# Patient Record
Sex: Male | Born: 1985 | Race: Black or African American | Hispanic: No | Marital: Single | Smoking: Current every day smoker
Health system: Southern US, Community
[De-identification: ages and names within clinical notes are randomized; demographics above are authoritative.]

## PROBLEM LIST (undated history)

## (undated) ENCOUNTER — Emergency Department: Discharge status: Absent without leave | Payer: Self-pay

## (undated) DIAGNOSIS — Z86711 Personal history of pulmonary embolism: Secondary | ICD-10-CM

## (undated) DIAGNOSIS — F209 Schizophrenia, unspecified: Secondary | ICD-10-CM

## (undated) DIAGNOSIS — F319 Bipolar disorder, unspecified: Secondary | ICD-10-CM

---
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
## 2006-04-23 ENCOUNTER — Emergency Department: Payer: Self-pay | Admitting: Emergency Medicine

## 2007-04-04 ENCOUNTER — Emergency Department: Payer: Self-pay | Admitting: Emergency Medicine

## 2007-07-14 ENCOUNTER — Emergency Department: Payer: Self-pay | Admitting: Emergency Medicine

## 2008-06-15 ENCOUNTER — Emergency Department: Payer: Self-pay | Admitting: Emergency Medicine

## 2010-12-22 ENCOUNTER — Emergency Department: Payer: Self-pay | Admitting: Emergency Medicine

## 2010-12-31 ENCOUNTER — Emergency Department: Payer: Self-pay | Admitting: Emergency Medicine

## 2011-02-22 ENCOUNTER — Emergency Department: Payer: Self-pay | Admitting: Emergency Medicine

## 2011-02-26 ENCOUNTER — Emergency Department: Payer: Self-pay | Admitting: Emergency Medicine

## 2011-08-22 ENCOUNTER — Emergency Department: Payer: Self-pay | Admitting: Emergency Medicine

## 2011-12-17 ENCOUNTER — Emergency Department: Payer: Self-pay | Admitting: Emergency Medicine

## 2013-06-04 ENCOUNTER — Emergency Department: Payer: Self-pay | Admitting: Emergency Medicine

## 2014-05-06 ENCOUNTER — Emergency Department: Payer: Self-pay | Admitting: Emergency Medicine

## 2014-05-08 LAB — BETA STREP CULTURE(ARMC)

## 2017-02-09 ENCOUNTER — Encounter: Payer: Self-pay | Admitting: Emergency Medicine

## 2017-02-09 ENCOUNTER — Emergency Department: Payer: Self-pay

## 2017-02-09 ENCOUNTER — Emergency Department
Admission: EM | Admit: 2017-02-09 | Discharge: 2017-02-09 | Disposition: A | Discharge status: Home / Self Care | Payer: Self-pay | Attending: Emergency Medicine | Admitting: Emergency Medicine

## 2017-02-09 DIAGNOSIS — W501XXA Accidental kick by another person, initial encounter: Secondary | ICD-10-CM | POA: Insufficient documentation

## 2017-02-09 DIAGNOSIS — Y999 Unspecified external cause status: Secondary | ICD-10-CM | POA: Insufficient documentation

## 2017-02-09 DIAGNOSIS — Y929 Unspecified place or not applicable: Secondary | ICD-10-CM | POA: Insufficient documentation

## 2017-02-09 DIAGNOSIS — Y939 Activity, unspecified: Secondary | ICD-10-CM | POA: Insufficient documentation

## 2017-02-09 DIAGNOSIS — F17200 Nicotine dependence, unspecified, uncomplicated: Secondary | ICD-10-CM | POA: Insufficient documentation

## 2017-02-09 DIAGNOSIS — S93601A Unspecified sprain of right foot, initial encounter: Secondary | ICD-10-CM | POA: Insufficient documentation

## 2017-02-09 MED ORDER — IBUPROFEN 600 MG PO TABS: 600.0000 mg | ORAL_TABLET | Freq: Four times a day (QID) | ORAL | 0 refills | Status: DC | PRN

## 2017-02-09 MED ORDER — OXYCODONE-ACETAMINOPHEN 5-325 MG PO TABS: 1.0000 | ORAL_TABLET | Freq: Four times a day (QID) | ORAL | 0 refills | Status: DC | PRN

## 2017-02-09 MED ORDER — IBUPROFEN 600 MG PO TABS
600.0000 mg | ORAL_TABLET | Freq: Once | ORAL | Status: AC
  Administered 2017-02-09: 600 mg via ORAL
  Filled 2017-02-09: qty 1

## 2017-02-09 MED ORDER — OXYCODONE-ACETAMINOPHEN 5-325 MG PO TABS
1.0000 | ORAL_TABLET | Freq: Once | ORAL | Status: AC
  Administered 2017-02-09: 1 via ORAL
  Filled 2017-02-09: qty 1

## 2017-02-09 NOTE — ED Triage Notes (Signed)
States he slipped down step last pm  Twisted right foot  Positive swelling noted unable to bear wt

## 2017-02-09 NOTE — Discharge Instructions (Signed)
Ambulate with support and follow discharge care instructions.

## 2017-02-09 NOTE — ED Provider Notes (Signed)
[facility] Emergency Department Provider Note   ____________________________________________   None    (approximate)  I have reviewed the triage vital signs and the nursing notes.   HISTORY  Chief Complaint Foot Injury    HPI Patrick Sparks is a 31 y.o. male patient complain of right dorsal foot pain secondary to a slip and twisting incident last night. Patient states since the fall he is unable to bear weight without pain. No palliative measures taken for this complaint. Patient rates the pain at 7/10.Patient described the pain as "sharp".   History reviewed. No pertinent past medical history.  There are no active problems to display for this patient.   History reviewed. No pertinent surgical history.  Prior to Admission medications   Medication Sig Start Date End Date Taking? Authorizing Provider  ibuprofen (ADVIL,MOTRIN) 600 MG tablet Take 1 tablet (600 mg total) by mouth every 6 (six) hours as needed. 02/09/17   Joni Reiningonald K Smith, PA-C  oxyCODONE-acetaminophen (ROXICET) 5-325 MG tablet Take 1 tablet by mouth every 6 (six) hours as needed for severe pain. 02/09/17   Joni Reiningonald K Smith, PA-C    Allergies Patient has no known allergies.  No family history on file.  Social History Social History  Substance Use Topics  . Smoking status: Current Every Day Smoker  . Smokeless tobacco: Never Used  . Alcohol use No    Review of Systems Constitutional: No fever/chills Eyes: No visual changes. ENT: No sore throat. Cardiovascular: Denies chest pain. Respiratory: Denies shortness of breath. Gastrointestinal: No abdominal pain.  No nausea, no vomiting.  No diarrhea.  No constipation. Genitourinary: Negative for dysuria. Musculoskeletal: Right dorsal foot pain Skin: Negative for rash. Neurological: Negative for headaches, focal weakness or numbness.    ____________________________________________   PHYSICAL EXAM:  VITAL SIGNS: ED  Triage Vitals  Enc Vitals Group     BP 02/09/17 1705 (!) 141/75     Pulse Rate 02/09/17 1705 79     Resp 02/09/17 1705 20     Temp 02/09/17 1705 98.4 F (36.9 C)     Temp Source 02/09/17 1705 Oral     SpO2 02/09/17 1705 98 %     Weight 02/09/17 1705 230 lb (104.3 kg)     Height 02/09/17 1705 5\' 8"  (1.727 m)     Head Circumference --      Peak Flow --      Pain Score 02/09/17 1706 7     Pain Loc --      Pain Edu? --      Excl. in GC? --     Constitutional: Alert and oriented. Well appearing and in no acute distress. Eyes: Conjunctivae are normal. PERRL. EOMI. Head: Atraumatic. Nose: No congestion/rhinnorhea. Mouth/Throat: Mucous membranes are moist.  Oropharynx non-erythematous. Neck: No stridor.  No cervical spine tenderness to palpation. Hematological/Lymphatic/Immunilogical: No cervical lymphadenopathy. Cardiovascular: Normal rate, regular rhythm. Grossly normal heart sounds.  Good peripheral circulation. Respiratory: Normal respiratory effort.  No retractions. Lungs CTAB. Gastrointestinal: Soft and nontender. No distention. No abdominal bruits. No CVA tenderness. Musculoskeletal: Obvious edema but no obvious deformity to the dorsal aspect of the right foot. Neurologic:  Normal speech and language. No gross focal neurologic deficits are appreciated. No gait instability. Skin:  Skin is warm, dry and intact. No rash noted. Psychiatric: Mood and affect are normal. Speech and behavior are normal.  ____________________________________________   LABS (all labs ordered are listed, but only abnormal results are displayed)  Labs Reviewed -  No data to display ____________________________________________  EKG   ____________________________________________  RADIOLOGY  No acute findings on x-ray of the right foot. ____________________________________________   PROCEDURES  Procedure(s) performed: None  Procedures  Critical Care performed:  No  ____________________________________________   INITIAL IMPRESSION / ASSESSMENT AND PLAN / ED COURSE  Pertinent labs & imaging results that were available during my care of the patient were reviewed by me and considered in my medical decision making (see chart for details).  Sprain right foot. Discuss x-ray finding with patient. Patient given discharge care instructions. Patient given ibuprofen and Percocets. Patient given crutches for ambulation. Patient [facility] no improvement within 3-5 days.      ____________________________________________   FINAL CLINICAL IMPRESSION(S) / ED DIAGNOSES  Final diagnoses:  Sprain of right foot, initial encounter      NEW MEDICATIONS STARTED DURING THIS VISIT:  New Prescriptions   IBUPROFEN (ADVIL,MOTRIN) 600 MG TABLET    Take 1 tablet (600 mg total) by mouth every 6 (six) hours as needed.   OXYCODONE-ACETAMINOPHEN (ROXICET) 5-325 MG TABLET    Take 1 tablet by mouth every 6 (six) hours as needed for severe pain.     Note:  This document was prepared using Dragon voice recognition software and may include unintentional dictation errors.    Joni Reining, PA-C 02/09/17 1805    Loleta Rose, MD 02/09/17 [phone]

## 2017-03-08 ENCOUNTER — Emergency Department
Admission: EM | Admit: 2017-03-08 | Discharge: 2017-03-08 | Disposition: A | Discharge status: Home / Self Care | Payer: Self-pay | Attending: Emergency Medicine | Admitting: Emergency Medicine

## 2017-03-08 DIAGNOSIS — Z79899 Other long term (current) drug therapy: Secondary | ICD-10-CM | POA: Insufficient documentation

## 2017-03-08 DIAGNOSIS — F17200 Nicotine dependence, unspecified, uncomplicated: Secondary | ICD-10-CM | POA: Insufficient documentation

## 2017-03-08 DIAGNOSIS — F1910 Other psychoactive substance abuse, uncomplicated: Secondary | ICD-10-CM | POA: Insufficient documentation

## 2017-03-08 LAB — COMPREHENSIVE METABOLIC PANEL
ALT: 28 U/L (ref 17–63)
ANION GAP: 9 (ref 5–15)
AST: 30 U/L (ref 15–41)
Albumin: 4.3 g/dL (ref 3.5–5.0)
Alkaline Phosphatase: 54 U/L (ref 38–126)
BILIRUBIN TOTAL: 0.8 mg/dL (ref 0.3–1.2)
BUN: 6 mg/dL (ref 6–20)
CALCIUM: 9.2 mg/dL (ref 8.9–10.3)
CO2: 25 mmol/L (ref 22–32)
CREATININE: 0.73 mg/dL (ref 0.61–1.24)
Chloride: 105 mmol/L (ref 101–111)
Glucose, Bld: 99 mg/dL (ref 65–99)
Potassium: 3.6 mmol/L (ref 3.5–5.1)
SODIUM: 139 mmol/L (ref 135–145)
TOTAL PROTEIN: 8.4 g/dL — AB (ref 6.5–8.1)

## 2017-03-08 LAB — CBC
HCT: 46.9 % (ref 40.0–52.0)
Hemoglobin: 15.7 g/dL (ref 13.0–18.0)
MCH: 30.1 pg (ref 26.0–34.0)
MCHC: 33.6 g/dL (ref 32.0–36.0)
MCV: 89.7 fL (ref 80.0–100.0)
PLATELETS: 216 10*3/uL (ref 150–440)
RBC: 5.23 MIL/uL (ref 4.40–5.90)
RDW: 14.8 % — AB (ref 11.5–14.5)
WBC: 6 10*3/uL (ref 3.8–10.6)

## 2017-03-08 LAB — URINE DRUG SCREEN, QUALITATIVE (ARMC ONLY)
AMPHETAMINES, UR SCREEN: NOT DETECTED
BENZODIAZEPINE, UR SCRN: NOT DETECTED
Barbiturates, Ur Screen: NOT DETECTED
Cannabinoid 50 Ng, Ur ~~LOC~~: NOT DETECTED
Cocaine Metabolite,Ur ~~LOC~~: POSITIVE — AB
MDMA (ECSTASY) UR SCREEN: NOT DETECTED
Methadone Scn, Ur: NOT DETECTED
Opiate, Ur Screen: NOT DETECTED
PHENCYCLIDINE (PCP) UR S: NOT DETECTED
TRICYCLIC, UR SCREEN: NOT DETECTED

## 2017-03-08 LAB — ETHANOL: ALCOHOL ETHYL (B): 177 mg/dL — AB (ref <5)

## 2017-03-08 LAB — TSH: TSH: 0.632 u[IU]/mL (ref 0.350–4.500)

## 2017-03-08 MED ORDER — HALOPERIDOL LACTATE 5 MG/ML IJ SOLN: 5.0000 mg | Freq: Four times a day (QID) | INTRAMUSCULAR | Status: DC | PRN

## 2017-03-08 MED ORDER — LORAZEPAM 2 MG/ML IJ SOLN
INTRAMUSCULAR | Status: AC
  Filled 2017-03-08: qty 1

## 2017-03-08 MED ORDER — LORAZEPAM 2 MG/ML IJ SOLN: 2.0000 mg | Freq: Four times a day (QID) | INTRAMUSCULAR | Status: DC | PRN

## 2017-03-08 MED ORDER — HALOPERIDOL LACTATE 5 MG/ML IJ SOLN
INTRAMUSCULAR | Status: DC
Start: 2017-03-08 — End: 2017-03-08
  Filled 2017-03-08: qty 1

## 2017-03-08 NOTE — ED Notes (Signed)
Pt will be discharged - awaiting reversal
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
## 2017-03-08 NOTE — ED Notes (Signed)
t-shirt, jeans, gray tennis shoes, cell phone, yellow ring,13 cents, an condoms in a ziploc bag with his id
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
## 2017-03-08 NOTE — ED Notes (Signed)
Pt moved from hallway into room 22

## 2017-03-08 NOTE — Discharge Instructions (Signed)
Please stop using cocaine and especially drinking alcohol. As the combined ingredient will create a heart related toxin. Please follow-up with our [facility] services for further outpatient evaluation for posttraumatic stress disorder, etc. ° °Please return immediately if condition worsens. Please contact her primary physician or the physician you were given for referral. If you have any specialist physicians involved in her treatment and plan please also contact them. Thank you for using [facility]. ° °

## 2017-03-08 NOTE — BH Assessment (Signed)
SOC calling in. TTS to consult at later time.

## 2017-03-08 NOTE — ED Notes (Signed)
Pt calling sister asking "what the fuck! Now I'm admitted". Continued talking to sister and asking why she called on him.

## 2018-02-13 ENCOUNTER — Other Ambulatory Visit: Payer: Self-pay

## 2018-02-13 ENCOUNTER — Emergency Department: Payer: No Typology Code available for payment source

## 2018-02-13 ENCOUNTER — Emergency Department
Admission: EM | Admit: 2018-02-13 | Discharge: 2018-02-13 | Disposition: A | Discharge status: Home / Self Care | Payer: No Typology Code available for payment source | Attending: Emergency Medicine | Admitting: Emergency Medicine

## 2018-02-13 DIAGNOSIS — R0789 Other chest pain: Secondary | ICD-10-CM | POA: Diagnosis not present

## 2018-02-13 DIAGNOSIS — M7918 Myalgia, other site: Secondary | ICD-10-CM

## 2018-02-13 DIAGNOSIS — Y9301 Activity, walking, marching and hiking: Secondary | ICD-10-CM | POA: Diagnosis not present

## 2018-02-13 DIAGNOSIS — F17200 Nicotine dependence, unspecified, uncomplicated: Secondary | ICD-10-CM | POA: Insufficient documentation

## 2018-02-13 DIAGNOSIS — Y92410 Unspecified street and highway as the place of occurrence of the external cause: Secondary | ICD-10-CM | POA: Insufficient documentation

## 2018-02-13 DIAGNOSIS — Y999 Unspecified external cause status: Secondary | ICD-10-CM | POA: Insufficient documentation

## 2018-02-13 DIAGNOSIS — M25512 Pain in left shoulder: Secondary | ICD-10-CM | POA: Diagnosis not present

## 2018-02-13 LAB — BASIC METABOLIC PANEL
Anion gap: 13 (ref 5–15)
BUN: 9 mg/dL (ref 6–20)
CHLORIDE: 105 mmol/L (ref 101–111)
CO2: 22 mmol/L (ref 22–32)
CREATININE: 0.74 mg/dL (ref 0.61–1.24)
Calcium: 8.9 mg/dL (ref 8.9–10.3)
GFR calc Af Amer: >60 mL/min (ref >60)
GFR calc non Af Amer: >60 mL/min (ref >60)
Glucose, Bld: 120 mg/dL — ABNORMAL HIGH (ref 65–99)
POTASSIUM: 4.2 mmol/L (ref 3.5–5.1)
Sodium: 140 mmol/L (ref 135–145)

## 2018-02-13 LAB — CBC
HEMATOCRIT: 43.4 % (ref 40.0–52.0)
HEMOGLOBIN: 14.4 g/dL (ref 13.0–18.0)
MCH: 28.7 pg (ref 26.0–34.0)
MCHC: 33.1 g/dL (ref 32.0–36.0)
MCV: 86.8 fL (ref 80.0–100.0)
Platelets: 216 10*3/uL (ref 150–440)
RBC: 5 MIL/uL (ref 4.40–5.90)
RDW: 12 % (ref 11.5–14.5)
WBC: 8.9 10*3/uL (ref 3.8–10.6)

## 2018-02-13 MED ORDER — ONDANSETRON HCL 4 MG/2ML IJ SOLN
4.0000 mg | Freq: Once | INTRAMUSCULAR | Status: AC
  Administered 2018-02-13: 4 mg via INTRAVENOUS
  Filled 2018-02-13: qty 2

## 2018-02-13 MED ORDER — TRAMADOL HCL 50 MG PO TABS
50.0000 mg | ORAL_TABLET | Freq: Four times a day (QID) | ORAL | 0 refills | Status: DC | PRN
Start: 2018-02-13 — End: 2018-04-15

## 2018-02-13 MED ORDER — MORPHINE SULFATE (PF) 4 MG/ML IV SOLN
4.0000 mg | Freq: Once | INTRAVENOUS | Status: AC
  Administered 2018-02-13: 4 mg via INTRAVENOUS
  Filled 2018-02-13: qty 1

## 2018-02-13 MED ORDER — IOPAMIDOL (ISOVUE-300) INJECTION 61%
100.0000 mL | Freq: Once | INTRAVENOUS | Status: AC | PRN
  Administered 2018-02-13: 100 mL via INTRAVENOUS

## 2018-02-13 MED ORDER — KETOROLAC TROMETHAMINE 30 MG/ML IJ SOLN
30.0000 mg | Freq: Once | INTRAMUSCULAR | Status: AC
  Administered 2018-02-13: 30 mg via INTRAVENOUS
  Filled 2018-02-13: qty 1

## 2018-02-13 NOTE — ED Notes (Signed)
Pt awake, alert and oriented x 4, smiling and laughing with friend in room, states his pain has decreased with medication received, some pain and soreness in left elbow and hand, good ROM and 2+ radial pulse distal to injury
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
## 2018-02-13 NOTE — Discharge Instructions (Signed)
Please follow up with your primary care physician or the acute care clinic °

## 2018-03-15 ENCOUNTER — Emergency Department: Payer: Self-pay

## 2018-03-15 ENCOUNTER — Emergency Department
Admission: EM | Admit: 2018-03-15 | Discharge: 2018-03-15 | Disposition: A | Discharge status: Home / Self Care | Payer: Self-pay | Attending: Emergency Medicine | Admitting: Emergency Medicine

## 2018-03-15 ENCOUNTER — Encounter: Payer: Self-pay | Admitting: Emergency Medicine

## 2018-03-15 ENCOUNTER — Other Ambulatory Visit: Payer: Self-pay

## 2018-03-15 DIAGNOSIS — R197 Diarrhea, unspecified: Secondary | ICD-10-CM | POA: Insufficient documentation

## 2018-03-15 DIAGNOSIS — R1084 Generalized abdominal pain: Secondary | ICD-10-CM

## 2018-03-15 DIAGNOSIS — F1020 Alcohol dependence, uncomplicated: Secondary | ICD-10-CM | POA: Insufficient documentation

## 2018-03-15 DIAGNOSIS — F17210 Nicotine dependence, cigarettes, uncomplicated: Secondary | ICD-10-CM | POA: Insufficient documentation

## 2018-03-15 DIAGNOSIS — R1013 Epigastric pain: Secondary | ICD-10-CM | POA: Insufficient documentation

## 2018-03-15 DIAGNOSIS — R110 Nausea: Secondary | ICD-10-CM | POA: Insufficient documentation

## 2018-03-15 LAB — TROPONIN I: Troponin I: <0.03 ng/mL (ref <0.03)

## 2018-03-15 LAB — URINALYSIS, COMPLETE (UACMP) WITH MICROSCOPIC
BILIRUBIN URINE: NEGATIVE
Bacteria, UA: NONE SEEN
GLUCOSE, UA: NEGATIVE mg/dL
HGB URINE DIPSTICK: NEGATIVE
KETONES UR: NEGATIVE mg/dL
LEUKOCYTES UA: NEGATIVE
Nitrite: NEGATIVE
PH: 6 (ref 5.0–8.0)
Protein, ur: 30 mg/dL — AB
Specific Gravity, Urine: 1.021 (ref 1.005–1.030)

## 2018-03-15 LAB — COMPREHENSIVE METABOLIC PANEL
ALT: 51 U/L (ref 17–63)
AST: 42 U/L — ABNORMAL HIGH (ref 15–41)
Albumin: 4.4 g/dL (ref 3.5–5.0)
Alkaline Phosphatase: 44 U/L (ref 38–126)
Anion gap: 10 (ref 5–15)
BUN: 18 mg/dL (ref 6–20)
CHLORIDE: 99 mmol/L — AB (ref 101–111)
CO2: 22 mmol/L (ref 22–32)
CREATININE: 0.95 mg/dL (ref 0.61–1.24)
Calcium: 8.7 mg/dL — ABNORMAL LOW (ref 8.9–10.3)
GFR calc Af Amer: >60 mL/min (ref >60)
Glucose, Bld: 95 mg/dL (ref 65–99)
POTASSIUM: 3.7 mmol/L (ref 3.5–5.1)
Sodium: 131 mmol/L — ABNORMAL LOW (ref 135–145)
Total Bilirubin: 1 mg/dL (ref 0.3–1.2)
Total Protein: 8.2 g/dL — ABNORMAL HIGH (ref 6.5–8.1)

## 2018-03-15 LAB — CBC
HCT: 51.1 % (ref 40.0–52.0)
Hemoglobin: 16.8 g/dL (ref 13.0–18.0)
MCH: 29.3 pg (ref 26.0–34.0)
MCHC: 32.9 g/dL (ref 32.0–36.0)
MCV: 89.1 fL (ref 80.0–100.0)
PLATELETS: 209 10*3/uL (ref 150–440)
RBC: 5.74 MIL/uL (ref 4.40–5.90)
RDW: 14.7 % — ABNORMAL HIGH (ref 11.5–14.5)
WBC: 10.6 10*3/uL (ref 3.8–10.6)

## 2018-03-15 LAB — LIPASE, BLOOD: LIPASE: 28 U/L (ref 11–51)

## 2018-03-15 MED ORDER — OMEPRAZOLE 40 MG PO CPDR: 40.0000 mg | DELAYED_RELEASE_CAPSULE | Freq: Every day | ORAL | 0 refills | Status: DC

## 2018-03-15 MED ORDER — ONDANSETRON HCL 4 MG/2ML IJ SOLN
4.0000 mg | Freq: Once | INTRAMUSCULAR | Status: AC
  Administered 2018-03-15: 4 mg via INTRAVENOUS
  Filled 2018-03-15: qty 2

## 2018-03-15 MED ORDER — ONDANSETRON 4 MG PO TBDP: 4.0000 mg | ORAL_TABLET | Freq: Once | ORAL | Status: DC | PRN

## 2018-03-15 MED ORDER — SODIUM CHLORIDE 0.9 % IV BOLUS (SEPSIS)
1000.0000 mL | Freq: Once | INTRAVENOUS | Status: AC
  Administered 2018-03-15: 1000 mL via INTRAVENOUS

## 2018-03-15 MED ORDER — MORPHINE SULFATE (PF) 4 MG/ML IV SOLN
4.0000 mg | Freq: Once | INTRAVENOUS | Status: AC
  Administered 2018-03-15: 4 mg via INTRAVENOUS
  Filled 2018-03-15: qty 1

## 2018-03-15 MED ORDER — ONDANSETRON HCL 4 MG PO TABS: 4.0000 mg | ORAL_TABLET | Freq: Every day | ORAL | 0 refills | Status: DC | PRN

## 2018-03-15 NOTE — ED Notes (Signed)
Reviewed discharge instructions, follow-up care, and prescriptions with patient. Patient verbalized understanding of all information reviewed. Patient stable, with no distress noted at this time.    

## 2018-03-15 NOTE — ED Provider Notes (Signed)
[facility] Emergency Department Provider Note  ____________________________________________   First MD Initiated Contact with Patient 03/15/18 2037     (approximate)  I have reviewed the triage vital signs and the nursing notes.   HISTORY  Chief Complaint Abdominal Pain   HPI Patrick Sparks is a 31 y.o. male with a history of alcohol as well as presented to the emergency department with epigastric and lower abdominal pain. He says the pain started about noon today and is a 10 out of 10 at this time. He says it has been radiating all around his abdomen but now settled in his epigastrium and suprapubic region. He says that he has been nauseous as well as having several episodes of diarrhea. However, has not vomited. Does not report any blood in the stool. Says that he drinks "all day everyday." Says that he is trying to stop. Says that he has been drinking like this since he has been 32 years old. Says that he often gets abdominal pain similar to this but usually goes away after about a half an hour. However, he says that the pain has been persistent today. He says that he is also diffuse bodyaches the pain is radiated up into his chest.  No past medical history on file.  There are no active problems to display for this patient.   No past surgical history on file.  Prior to Admission medications   Medication Sig Start Date End Date Taking? Authorizing Provider  ibuprofen (ADVIL,MOTRIN) 600 MG tablet Take 1 tablet (600 mg total) by mouth every 6 (six) hours as needed. 02/09/17   Joni Reining, PA-C  oxyCODONE-acetaminophen (ROXICET) 5-325 MG tablet Take 1 tablet by mouth every 6 (six) hours as needed for severe pain. 02/09/17   Joni Reining, PA-C  traMADol (ULTRAM) 50 MG tablet Take 1 tablet (50 mg total) by mouth every 6 (six) hours as needed. 02/13/18   Rebecka Apley, MD    Allergies Patient has no known allergies.  No family history on  file.  Social History Social History   Tobacco Use  . Smoking status: Current Every Day Smoker    Types: Cigarettes  . Smokeless tobacco: Never Used  Substance Use Topics  . Alcohol use: Yes  . Drug use: Not on file    Review of Systems  Constitutional: No fever/chills Eyes: No visual changes. ENT: No sore throat. Cardiovascular: Denies chest pain. Respiratory: Denies shortness of breath. Gastrointestinal:No constipation. Genitourinary: Negative for dysuria. Musculoskeletal: Negative for back pain. Skin: Negative for rash. Neurological: Negative for headaches, focal weakness or numbness.   ____________________________________________   PHYSICAL EXAM:  VITAL SIGNS: ED Triage Vitals [03/15/18 1800]  Enc Vitals Group     BP 128/71     Pulse Rate 87     Resp 19     Temp 98.9 F (37.2 C)     Temp Source Oral     SpO2 97 %     Weight 225 lb (102.1 kg)     Height 5\' 8"  (1.727 m)     Head Circumference      Peak Flow      Pain Score 10     Pain Loc      Pain Edu?      Excl. in GC?     Constitutional: Alert and oriented. Well appearing and in no acute distress. Eyes: Conjunctivae are normal.  Head: Atraumatic. Nose: No congestion/rhinnorhea. Mouth/Throat: Mucous membranes are moist.  Neck: No stridor.   Cardiovascular: Normal rate, regular rhythm. Grossly normal heart sounds.  Respiratory: Normal respiratory effort.  No retractions. Lungs CTAB. Gastrointestinal: Soft with moderate tenderness in epigastrium as well as suprapubic region without any rebound or guarding No distention. No CVA tenderness. Musculoskeletal: No lower extremity tenderness nor edema.  No joint effusions. Neurologic:  Normal speech and language. No gross focal neurologic deficits are appreciated. Skin:  Skin is warm, dry and intact. No rash noted. Psychiatric: Mood and affect are normal. Speech and behavior are normal.  ____________________________________________   LABS (all labs  ordered are listed, but only abnormal results are displayed)  Labs Reviewed  COMPREHENSIVE METABOLIC PANEL - Abnormal; Notable for the following components:      Result Value   Sodium 131 (*)    Chloride 99 (*)    Calcium 8.7 (*)    Total Protein 8.2 (*)    AST 42 (*)    All other components within normal limits  CBC - Abnormal; Notable for the following components:   RDW 14.7 (*)    All other components within normal limits  URINALYSIS, COMPLETE (UACMP) WITH MICROSCOPIC - Abnormal; Notable for the following components:   Color, Urine YELLOW (*)    APPearance CLEAR (*)    Protein, ur 30 (*)    Squamous Epithelial / LPF 0-5 (*)    All other components within normal limits  LIPASE, BLOOD  TROPONIN I   ____________________________________________  EKG  ED ECG REPORT I, Arelia Longest, the attending physician, personally viewed and interpreted this ECG.   Date: 03/15/2018  EKG Time: 1808  Rate: 84  Rhythm: normal sinus rhythm  Axis: normal  Intervals:none  ST&T Change: no ST segment elevation or depression. No abnormal T-wave inversion.  ____________________________________________  RADIOLOGY  no active cardiopulmonary disease on chest x-ray. ____________________________________________   PROCEDURES  Procedure(s) performed:   Procedures  Critical Care performed:   ____________________________________________   INITIAL IMPRESSION / ASSESSMENT AND PLAN / ED COURSE  Pertinent labs & imaging results that were available during my care of the patient were reviewed by me and considered in my medical decision making (see chart for details).  Differential diagnosis includes, but is not limited to, ACS, aortic dissection, pulmonary embolism, cardiac tamponade, pneumothorax, pneumonia, pericarditis, myocarditis, GI-related causes including esophagitis/gastritis, and musculoskeletal chest wall pain.   Differential diagnosis includes, but is not limited to, acute  appendicitis, renal colic, testicular torsion, urinary tract infection/pyelonephritis, prostatitis,  epididymitis, diverticulitis, small bowel obstruction or ileus, colitis, abdominal aortic aneurysm, gastroenteritis, hernia, etc. Differential diagnosis includes, but is not limited to, biliary disease (biliary colic, acute cholecystitis, cholangitis, choledocholithiasis, etc), intrathoracic causes for epigastric abdominal pain including ACS, gastritis, duodenitis, pancreatitis, small bowel or large bowel obstruction, abdominal aortic aneurysm, hernia, and gastritis. As part of my medical decision making, I reviewed the following data within the electronic MEDICAL RECORD NUMBER Notes from prior ED visits  ----------------------------------------- 10:10 PM on 03/15/2018 -----------------------------------------  Patient says that his pain is improved at this time. Abdomen soft and nontender throughout. Tolerating by mouth fluids. Possibly abdominal pain with diarrhea and nausea from viral cause versus GI upset from alcoholism. We'll discharge with omeprazole as well as Zofran. Possible ulcer as well. We'll also give follow-up with GI. Also to follow-up with [facility] for substance issues. Patient understands the plan and willing to quite will be discharged at this time. ____________________________________________   FINAL CLINICAL IMPRESSION(S) / ED DIAGNOSES  abdominal pain. Nausea and diarrhea. Alcoholism.  NEW MEDICATIONS STARTED DURING THIS VISIT:  New Prescriptions   No medications on file     Note:  This document was prepared using Dragon voice recognition software and may include unintentional dictation errors.     Myrna BlazerSchaevitz, Kaylianna Detert Matthew, MD 03/15/18 [phone]

## 2018-03-15 NOTE — ED Notes (Signed)
Pt feeling sick with abd cramping and pain since noon. C/o diarrhea and nausea. No vomiting. Today no appetite

## 2018-03-15 NOTE — ED Triage Notes (Signed)
Pt ambulatory to triage with reports of 10/10 abdominal pain that began today at noon  Pt reports nausea but no emesis  +BM since pain began

## 2018-03-16 ENCOUNTER — Telehealth: Payer: Self-pay | Admitting: Gastroenterology

## 2018-03-16 NOTE — Telephone Encounter (Signed)
Left vm to schedule 1 week fu with Dr. Allegra LaiVanga per note

## 2018-03-20 ENCOUNTER — Emergency Department: Payer: Self-pay

## 2018-03-20 ENCOUNTER — Encounter: Payer: Self-pay | Admitting: Emergency Medicine

## 2018-03-20 ENCOUNTER — Other Ambulatory Visit: Payer: Self-pay

## 2018-03-20 ENCOUNTER — Emergency Department
Admission: EM | Admit: 2018-03-20 | Discharge: 2018-03-20 | Disposition: A | Discharge status: Home / Self Care | Payer: Self-pay | Attending: Student in an Organized Health Care Education/Training Program | Admitting: Student in an Organized Health Care Education/Training Program

## 2018-03-20 DIAGNOSIS — Z79899 Other long term (current) drug therapy: Secondary | ICD-10-CM | POA: Insufficient documentation

## 2018-03-20 DIAGNOSIS — S01112A Laceration without foreign body of left eyelid and periocular area, initial encounter: Secondary | ICD-10-CM | POA: Insufficient documentation

## 2018-03-20 DIAGNOSIS — R55 Syncope and collapse: Secondary | ICD-10-CM | POA: Insufficient documentation

## 2018-03-20 DIAGNOSIS — Y929 Unspecified place or not applicable: Secondary | ICD-10-CM | POA: Insufficient documentation

## 2018-03-20 DIAGNOSIS — Y939 Activity, unspecified: Secondary | ICD-10-CM | POA: Insufficient documentation

## 2018-03-20 DIAGNOSIS — F17210 Nicotine dependence, cigarettes, uncomplicated: Secondary | ICD-10-CM | POA: Insufficient documentation

## 2018-03-20 DIAGNOSIS — Y998 Other external cause status: Secondary | ICD-10-CM | POA: Insufficient documentation

## 2018-03-20 DIAGNOSIS — S0990XA Unspecified injury of head, initial encounter: Secondary | ICD-10-CM

## 2018-03-20 DIAGNOSIS — M542 Cervicalgia: Secondary | ICD-10-CM | POA: Insufficient documentation

## 2018-03-20 MED ORDER — POLYMYXIN B-TRIMETHOPRIM 10000-0.1 UNIT/ML-% OP SOLN: 1.0000 [drp] | Freq: Four times a day (QID) | OPHTHALMIC | 0 refills | Status: DC

## 2018-03-20 MED ORDER — BUTALBITAL-APAP-CAFFEINE 50-325-40 MG PO TABS: 1.0000 | ORAL_TABLET | Freq: Four times a day (QID) | ORAL | 0 refills | Status: DC | PRN

## 2018-03-20 MED ORDER — LIDOCAINE-EPINEPHRINE-TETRACAINE (LET) SOLUTION
3.0000 mL | Freq: Once | NASAL | Status: AC
  Administered 2018-03-20: 3 mL via TOPICAL
  Filled 2018-03-20: qty 3

## 2018-03-20 MED ORDER — TETRACAINE HCL 0.5 % OP SOLN
OPHTHALMIC | Status: AC
  Filled 2018-03-20: qty 4

## 2018-03-20 MED ORDER — LIDOCAINE HCL (PF) 1 % IJ SOLN
5.0000 mL | Freq: Once | INTRAMUSCULAR | Status: AC
  Administered 2018-03-20: 5 mL via INTRADERMAL
  Filled 2018-03-20: qty 5

## 2018-03-20 MED ORDER — BUTALBITAL-APAP-CAFFEINE 50-325-40 MG PO TABS
2.0000 | ORAL_TABLET | ORAL | Status: DC | PRN
  Administered 2018-03-20: 2 via ORAL
  Filled 2018-03-20: qty 2

## 2018-03-20 MED ORDER — FLUORESCEIN SODIUM 1 MG OP STRP
1.0000 | ORAL_STRIP | Freq: Once | OPHTHALMIC | Status: DC
  Filled 2018-03-20: qty 1
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
## 2018-03-20 NOTE — ED Notes (Signed)
Patient transported to CT 

## 2018-03-23 NOTE — Telephone Encounter (Signed)
Left  Another message with pt to call office and schedule FU per Dr. Allegra LaiVanga

## 2018-03-26 ENCOUNTER — Telehealth: Payer: Self-pay | Admitting: Gastroenterology

## 2018-03-26 NOTE — Telephone Encounter (Signed)
Left vm to set up ED Fu with Dr. Allegra LaiVanga per note

## 2018-04-14 ENCOUNTER — Emergency Department
Admission: EM | Admit: 2018-04-14 | Discharge: 2018-04-14 | Disposition: A | Discharge status: Other Acute Inpatient Hospital | Payer: Self-pay | Attending: Emergency Medicine | Admitting: Emergency Medicine

## 2018-04-14 ENCOUNTER — Inpatient Hospital Stay
Admission: AD | Admit: 2018-04-14 | Discharge: 2018-04-15 | DRG: 897 | Disposition: A | Discharge status: Home / Self Care | Payer: No Typology Code available for payment source | Source: Intra-hospital | Attending: Psychiatry | Admitting: Psychiatry

## 2018-04-14 ENCOUNTER — Other Ambulatory Visit: Payer: Self-pay

## 2018-04-14 DIAGNOSIS — F1410 Cocaine abuse, uncomplicated: Secondary | ICD-10-CM | POA: Diagnosis present

## 2018-04-14 DIAGNOSIS — Z91128 Patient's intentional underdosing of medication regimen for other reason: Secondary | ICD-10-CM | POA: Diagnosis not present

## 2018-04-14 DIAGNOSIS — F3130 Bipolar disorder, current episode depressed, mild or moderate severity, unspecified: Secondary | ICD-10-CM | POA: Diagnosis present

## 2018-04-14 DIAGNOSIS — F319 Bipolar disorder, unspecified: Secondary | ICD-10-CM

## 2018-04-14 DIAGNOSIS — F1010 Alcohol abuse, uncomplicated: Secondary | ICD-10-CM | POA: Diagnosis present

## 2018-04-14 DIAGNOSIS — F10929 Alcohol use, unspecified with intoxication, unspecified: Secondary | ICD-10-CM | POA: Insufficient documentation

## 2018-04-14 DIAGNOSIS — R45851 Suicidal ideations: Secondary | ICD-10-CM | POA: Insufficient documentation

## 2018-04-14 DIAGNOSIS — F14929 Cocaine use, unspecified with intoxication, unspecified: Secondary | ICD-10-CM | POA: Insufficient documentation

## 2018-04-14 DIAGNOSIS — F1994 Other psychoactive substance use, unspecified with psychoactive substance-induced mood disorder: Secondary | ICD-10-CM | POA: Diagnosis present

## 2018-04-14 DIAGNOSIS — Y907 Blood alcohol level of 200-239 mg/100 ml: Secondary | ICD-10-CM | POA: Diagnosis present

## 2018-04-14 DIAGNOSIS — F10129 Alcohol abuse with intoxication, unspecified: Secondary | ICD-10-CM | POA: Diagnosis present

## 2018-04-14 DIAGNOSIS — Z79899 Other long term (current) drug therapy: Secondary | ICD-10-CM | POA: Insufficient documentation

## 2018-04-14 DIAGNOSIS — F17210 Nicotine dependence, cigarettes, uncomplicated: Secondary | ICD-10-CM | POA: Diagnosis present

## 2018-04-14 LAB — URINE DRUG SCREEN, QUALITATIVE (ARMC ONLY)
Amphetamines, Ur Screen: NOT DETECTED
Barbiturates, Ur Screen: NOT DETECTED
Benzodiazepine, Ur Scrn: NOT DETECTED
Cannabinoid 50 Ng, Ur ~~LOC~~: NOT DETECTED
Cocaine Metabolite,Ur ~~LOC~~: POSITIVE — AB
MDMA (Ecstasy)Ur Screen: NOT DETECTED
Methadone Scn, Ur: NOT DETECTED
Opiate, Ur Screen: NOT DETECTED
Phencyclidine (PCP) Ur S: NOT DETECTED
Tricyclic, Ur Screen: NOT DETECTED

## 2018-04-14 LAB — CBC WITH DIFFERENTIAL/PLATELET
Basophils Absolute: 0 10*3/uL (ref 0–0.1)
Basophils Relative: 1 %
Eosinophils Absolute: 0.1 10*3/uL (ref 0–0.7)
Eosinophils Relative: 1 %
HCT: 47 % (ref 40.0–52.0)
Hemoglobin: 15.6 g/dL (ref 13.0–18.0)
Lymphocytes Relative: 25 %
Lymphs Abs: 1.5 10*3/uL (ref 1.0–3.6)
MCH: 30.5 pg (ref 26.0–34.0)
MCHC: 33.2 g/dL (ref 32.0–36.0)
MCV: 92.1 fL (ref 80.0–100.0)
Monocytes Absolute: 0.3 10*3/uL (ref 0.2–1.0)
Monocytes Relative: 5 %
Neutro Abs: 4.1 10*3/uL (ref 1.4–6.5)
Neutrophils Relative %: 68 %
Platelets: 224 10*3/uL (ref 150–440)
RBC: 5.1 MIL/uL (ref 4.40–5.90)
RDW: 14.3 % (ref 11.5–14.5)
WBC: 6 10*3/uL (ref 3.8–10.6)

## 2018-04-14 LAB — COMPREHENSIVE METABOLIC PANEL
ALT: 34 U/L (ref 17–63)
AST: 29 U/L (ref 15–41)
Albumin: 4.2 g/dL (ref 3.5–5.0)
Alkaline Phosphatase: 43 U/L (ref 38–126)
Anion gap: 9 (ref 5–15)
BUN: 7 mg/dL (ref 6–20)
CO2: 25 mmol/L (ref 22–32)
Calcium: 8.9 mg/dL (ref 8.9–10.3)
Chloride: 105 mmol/L (ref 101–111)
Creatinine, Ser: 0.87 mg/dL (ref 0.61–1.24)
GFR calc Af Amer: >60 mL/min (ref >60)
GFR calc non Af Amer: >60 mL/min (ref >60)
Glucose, Bld: 105 mg/dL — ABNORMAL HIGH (ref 65–99)
Potassium: 3.4 mmol/L — ABNORMAL LOW (ref 3.5–5.1)
Sodium: 139 mmol/L (ref 135–145)
Total Bilirubin: 0.9 mg/dL (ref 0.3–1.2)
Total Protein: 7.6 g/dL (ref 6.5–8.1)

## 2018-04-14 LAB — CK: Total CK: 534 U/L — ABNORMAL HIGH (ref 49–397)

## 2018-04-14 LAB — ETHANOL: Alcohol, Ethyl (B): 200 mg/dL — ABNORMAL HIGH (ref <10)

## 2018-04-14 LAB — SALICYLATE LEVEL: Salicylate Lvl: <7 mg/dL (ref 2.8–30.0)

## 2018-04-14 LAB — ACETAMINOPHEN LEVEL: Acetaminophen (Tylenol), Serum: <10 ug/mL — ABNORMAL LOW (ref 10–30)

## 2018-04-14 MED ORDER — LORAZEPAM 2 MG PO TABS
2.0000 mg | ORAL_TABLET | Freq: Once | ORAL | Status: DC
  Administered 2018-04-14: 2 mg via ORAL
  Filled 2018-04-14: qty 1

## 2018-04-14 MED ORDER — MAGNESIUM HYDROXIDE 400 MG/5ML PO SUSP: 30.0000 mL | Freq: Every day | ORAL | Status: DC | PRN

## 2018-04-14 MED ORDER — TRAZODONE HCL 100 MG PO TABS: 100.0000 mg | ORAL_TABLET | Freq: Every evening | ORAL | Status: DC | PRN

## 2018-04-14 MED ORDER — OLANZAPINE 10 MG PO TABS
10.0000 mg | ORAL_TABLET | Freq: Once | ORAL | Status: AC
  Administered 2018-04-14: 10 mg via ORAL
  Filled 2018-04-14: qty 1

## 2018-04-14 MED ORDER — ACETAMINOPHEN 325 MG PO TABS
650.0000 mg | ORAL_TABLET | Freq: Once | ORAL | Status: AC
Start: 2018-04-14 — End: 2018-04-14
  Administered 2018-04-14: 650 mg via ORAL
  Filled 2018-04-14: qty 2

## 2018-04-14 MED ORDER — HYDROXYZINE HCL 25 MG PO TABS: 25.0000 mg | ORAL_TABLET | Freq: Three times a day (TID) | ORAL | Status: DC | PRN

## 2018-04-14 MED ORDER — LORAZEPAM 2 MG PO TABS
2.0000 mg | ORAL_TABLET | Freq: Once | ORAL | Status: AC
  Administered 2018-04-14: 2 mg via ORAL
  Filled 2018-04-14: qty 1

## 2018-04-14 MED ORDER — ACETAMINOPHEN 325 MG PO TABS: 650.0000 mg | ORAL_TABLET | Freq: Four times a day (QID) | ORAL | Status: DC | PRN

## 2018-04-14 MED ORDER — ALUM & MAG HYDROXIDE-SIMETH 200-200-20 MG/5ML PO SUSP: 30.0000 mL | ORAL | Status: DC | PRN

## 2018-04-14 NOTE — Plan of Care (Signed)
New Admission  Patient has not yet started  work on problems   Problem: Coping: Goal: Coping ability will improve Outcome: Not Progressing   Problem: Medication: Goal: Compliance with prescribed medication regimen will improve Outcome: Not Progressing   Problem: Education: Goal: Utilization of techniques to improve thought processes will improve Outcome: Not Progressing Goal: Knowledge of the prescribed therapeutic regimen will improve Outcome: Not Progressing   Problem: Education: Goal: Knowledge of [facility] Education information/materials will improve Outcome: Not Progressing
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
## 2018-04-14 NOTE — ED Notes (Signed)
[place]

## 2018-04-14 NOTE — ED Notes (Signed)
Meal tray given 
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
## 2018-04-14 NOTE — ED Triage Notes (Signed)
[facility] arrived from [facility] with complaints of SI. Per EMS patient was hitting head on floor/chair and has not took medications for bipolar in a few days. Patient states he is not drunk and not on drugs. Patient states he did "blow" 2 days ago. Patient states having SI. Patient unable to contract for safety with this Clinical research associatewriter. Patient would not ask other psych assessment questions at time of triage.

## 2018-04-14 NOTE — ED Notes (Signed)
Breakfast tray placed in pt room. Pt sleeping 

## 2018-04-14 NOTE — BH Assessment (Signed)
[facility] by Dr. Toni Amendlapacs.  Attending Physician will be Dr. Johnella MoloneyMcNew.   Patient has been assigned to room 322-A, by BMU Charge Nurse Lillette BoxerGwen F.   ER staff is aware of the admission:  Emilie, ER Sectary   Dr. Manson PasseyBrown, ER MD   Victorino DikeJennifer, Patient's Nurse   Ethelene BrownsAnthony, Patient Access.
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
## 2018-04-14 NOTE — ED Notes (Signed)
Patient able to participate in assessment. Patient states having SI without a plan and is able to contract for safety with this Clinical research associatewriter. Patient denies HI and VH. Patient states having AH of a voice saying memories.

## 2018-04-14 NOTE — ED Notes (Signed)
Patient complaining of neck pain. EDP made aware.

## 2018-04-14 NOTE — ED Notes (Signed)
Pt is sleeping

## 2018-04-14 NOTE — ED Notes (Signed)
Patient states he wants to speak to psychiatrist, that he is ready to go home. Patient continues to repeat "I'm good, I'm just ready to go home.". Dr. Toni Amendlapacs aware and to come back to see patient.

## 2018-04-14 NOTE — Consult Note (Signed)
[place] [facility] Consult   Reason for Consult: Consult for 32 year old man brought in last night by authorities agitated reporting suicidal thoughts Referring Physician: Owens Shark Patient Identification: Patrick Sparks MRN:  [id number] Principal Diagnosis: Bipolar 1 disorder, depressed ([place]) Diagnosis:   Patient Active Problem List   Diagnosis Date Noted  . Bipolar 1 disorder, depressed ([place]) [F31.9] 04/14/2018  . Alcohol abuse [F10.10] 04/14/2018  . Cocaine abuse ([place]) [F14.10] 04/14/2018    Total Time spent with patient: 1 hour  Subjective:   Patrick Sparks is a 32 y.o. male patient admitted with "I was not wanting to live".  HPI: Patient interviewed chart reviewed.  This is a 32 year old man who came to the emergency room last night brought by authorities.  He was agitated at the time and had been banging his head.  It is documented that he said that he was having thoughts of killing himself and that he felt desperate to get help.  That he had been feeling depressed and down for a long time.  Apparently he went to [facility] yesterday to try to get put on some medicine but instead of getting the prescription filled he was drinking.  Patient says he drinks about 2 or 3 beers a day.  Blood alcohol level 200 on admission but the patient downplays the severity of it.  He says he last used cocaine 2 days ago.  Patient stays with his mother.  Not working.  Feels stressed out a lot of the time.  Gets paranoid feelings frequently and vague auditory hallucinations.  Fill history: Patient is not working.  Lives with his mother.  Limited social activity  Medical history: No known medical problems outside of the psychiatric  Substance abuse history: Regular use of alcohol but denies any withdrawal symptoms denies DTs or seizures.  Cocaine and marijuana use intermittently.  Patient downplays the significance of it.  Past Psychiatric History: Patient has been seen previously  at [facility] and used to take medicines regularly but has not been on them in a while.  Just recently feeling worse trying to get back on medicine.  Has been diagnosed with bipolar disorder in the past.  Cannot remember what medicines he was taking previously.  Does have a past history of cutting himself.  Risk to Self: Suicidal Ideation: Yes-Currently Present Suicidal Intent: Yes-Currently Present Is patient at risk for suicide?: Yes Suicidal Plan?: Yes-Currently Present Specify Current Suicidal Plan: "Whatever works" Access to [employer]: Yes Specify Access to Suicidal Means: Pt plans to bang head until he passes out What has been your use of drugs/alcohol within the last 12 months?: pt admits current drug abuse How many times?: 1 Triggers for Past Attempts: Unknown Intentional Self Injurious Behavior: Cutting, Damaging, Bruising Comment - Self Injurious Behavior: Banging head Risk to Others: Homicidal Ideation: No Thoughts of Harm to Others: No Current Homicidal Intent: No Current Homicidal Plan: No Access to Homicidal Means: No Identified Victim: n/a History of harm to others?: Yes Assessment of Violence: In distant past Violent Behavior Description: pt has lengthy criminal hx Does patient have access to weapons?: No Criminal Charges Pending?: No Does patient have a court date: No Prior Inpatient Therapy: Prior Inpatient Therapy: No Prior Outpatient Therapy: Prior Outpatient Therapy: Yes Prior Therapy Dates: Current Prior Therapy Facilty/Provider(s): [facility] Reason for Treatment: Mental Ilness Does patient have an ACCT team?: No Does patient have Intensive In-House Services?  : No Does patient have [facility] services? : No Does patient have P4CC services?: No  Past Medical History: No past medical history on file. No past surgical history on file. Family History: No family history on file. Family Psychiatric  History: None known Social History:  Social History   Substance and  Sexual Activity  Alcohol Use Yes     Social History   Substance and Sexual Activity  Drug Use Not on file    Social History   Socioeconomic History  . Marital status: Single    Spouse name: Not on file  . Number of children: Not on file  . Years of education: Not on file  . Highest education level: Not on file  Occupational History  . Not on file  Social Needs  . Financial resource strain: Not on file  . Food insecurity:    Worry: Not on file    Inability: Not on file  . Transportation needs:    Medical: Not on file    Non-medical: Not on file  Tobacco Use  . Smoking status: Current Every Day Smoker    Types: Cigarettes  . Smokeless tobacco: Never Used  Substance and Sexual Activity  . Alcohol use: Yes  . Drug use: Not on file  . Sexual activity: Not on file  Lifestyle  . Physical activity:    Days per week: Not on file    Minutes per session: Not on file  . Stress: Not on file  Relationships  . Social connections:    Talks on phone: Not on file    Gets together: Not on file    Attends religious service: Not on file    Active member of club or organization: Not on file    Attends meetings of clubs or organizations: Not on file    Relationship status: Not on file  Other Topics Concern  . Not on file  Social History Narrative  . Not on file   Additional Social History:    Allergies:  No Known Allergies  Labs:  Results for orders placed or performed during the hospital encounter of 04/14/18 (from the past 48 hour(s))  Comprehensive metabolic panel     Status: Abnormal   Collection Time: 04/14/18  2:00 AM  Result Value Ref Range   Sodium 139 135 - 145 mmol/L   Potassium 3.4 (L) 3.5 - 5.1 mmol/L   Chloride 105 101 - 111 mmol/L   CO2 25 22 - 32 mmol/L   Glucose, Bld 105 (H) 65 - 99 mg/dL   BUN 7 6 - 20 mg/dL   Creatinine, Ser 0.87 0.61 - 1.24 mg/dL   Calcium 8.9 8.9 - 10.3 mg/dL   Total Protein 7.6 6.5 - 8.1 g/dL   Albumin 4.2 3.5 - 5.0 g/dL   AST 29  15 - 41 U/L   ALT 34 17 - 63 U/L   Alkaline Phosphatase 43 38 - 126 U/L   Total Bilirubin 0.9 0.3 - 1.2 mg/dL   GFR calc non Af Amer >60 >60 mL/min   GFR calc Af Amer >60 >60 mL/min    Comment: (NOTE) The eGFR has been calculated using the CKD EPI equation. This calculation has not been validated in all clinical situations. eGFR's persistently <60 mL/min signify possible Chronic Kidney Disease.    Anion gap 9 5 - 15    Comment: Performed at [facility], [address] [place], [place] [address]  Ethanol     Status: Abnormal   Collection Time: 04/14/18  2:00 AM  Result Value Ref Range   Alcohol, Ethyl (  B) 200 (H) <10 mg/dL    Comment:        LOWEST DETECTABLE LIMIT FOR SERUM ALCOHOL IS 10 mg/dL FOR MEDICAL PURPOSES ONLY Performed at [facility], [address] [place], [place] [address]   Urine Drug Screen, Qualitative     Status: Abnormal   Collection Time: 04/14/18  2:00 AM  Result Value Ref Range   Tricyclic, Ur Screen NONE DETECTED NONE DETECTED   Amphetamines, Ur Screen NONE DETECTED NONE DETECTED   MDMA (Ecstasy)Ur Screen NONE DETECTED NONE DETECTED   Cocaine Metabolite,Ur Oxly POSITIVE (A) NONE DETECTED   Opiate, Ur Screen NONE DETECTED NONE DETECTED   Phencyclidine (PCP) Ur S NONE DETECTED NONE DETECTED   Cannabinoid 50 Ng, Ur [place] NONE DETECTED NONE DETECTED   Barbiturates, Ur Screen NONE DETECTED NONE DETECTED   Benzodiazepine, Ur Scrn NONE DETECTED NONE DETECTED   Methadone Scn, Ur NONE DETECTED NONE DETECTED    Comment: (NOTE) Tricyclics + metabolites, urine    Cutoff 1000 ng/mL Amphetamines + metabolites, urine  Cutoff 1000 ng/mL MDMA (Ecstasy), urine              Cutoff 500 ng/mL Cocaine Metabolite, urine          Cutoff 300 ng/mL Opiate + metabolites, urine        Cutoff 300 ng/mL Phencyclidine (PCP), urine         Cutoff 25 ng/mL Cannabinoid, urine                 Cutoff 50 ng/mL Barbiturates + metabolites, urine  Cutoff 200  ng/mL Benzodiazepine, urine              Cutoff 200 ng/mL Methadone, urine                   Cutoff 300 ng/mL The urine drug screen provides only a preliminary, unconfirmed analytical test result and should not be used for non-medical purposes. Clinical consideration and professional judgment should be applied to any positive drug screen result due to possible interfering substances. A more specific alternate chemical method must be used in order to obtain a confirmed analytical result. Gas chromatography / mass spectrometry (GC/MS) is the preferred confirmat ory method. Performed at [facility], [address] [place], [place] [address]   CBC with Diff     Status: None   Collection Time: 04/14/18  2:00 AM  Result Value Ref Range   WBC 6.0 3.8 - 10.6 K/uL   RBC 5.10 4.40 - 5.90 MIL/uL   Hemoglobin 15.6 13.0 - 18.0 g/dL   HCT 47.0 40.0 - 52.0 %   MCV 92.1 80.0 - 100.0 fL   MCH 30.5 26.0 - 34.0 pg   MCHC 33.2 32.0 - 36.0 g/dL   RDW 14.3 11.5 - 14.5 %   Platelets 224 150 - 440 K/uL   Neutrophils Relative % 68 %   Neutro Abs 4.1 1.4 - 6.5 K/uL   Lymphocytes Relative 25 %   Lymphs Abs 1.5 1.0 - 3.6 K/uL   Monocytes Relative 5 %   Monocytes Absolute 0.3 0.2 - 1.0 K/uL   Eosinophils Relative 1 %   Eosinophils Absolute 0.1 0 - 0.7 K/uL   Basophils Relative 1 %   Basophils Absolute 0.0 0 - 0.1 K/uL    Comment: Performed at [facility], [address] [place], [place] [address]  Salicylate level     Status: None   Collection Time: 04/14/18  2:00 AM  Result Value Ref Range   Salicylate Lvl <2.5 2.8 - 30.0 mg/dL    Comment: Performed at [facility], [address] [place], [place] [address]  Acetaminophen level     Status: Abnormal   Collection Time: 04/14/18  2:00 AM  Result Value Ref Range   Acetaminophen (Tylenol), Serum <10 (L) 10 - 30 ug/mL    Comment:        THERAPEUTIC CONCENTRATIONS VARY SIGNIFICANTLY. A RANGE OF 10-30 ug/mL MAY BE AN  EFFECTIVE CONCENTRATION FOR MANY PATIENTS. HOWEVER, SOME ARE BEST TREATED AT CONCENTRATIONS OUTSIDE THIS RANGE. ACETAMINOPHEN CONCENTRATIONS >150 ug/mL AT 4 HOURS AFTER INGESTION AND >50 ug/mL AT 12 HOURS AFTER INGESTION ARE OFTEN ASSOCIATED WITH TOXIC REACTIONS. Performed at [facility], [address] [place], [place] [address]   CK     Status: Abnormal   Collection Time: 04/14/18  2:27 AM  Result Value Ref Range   Total CK 534 (H) 49 - 397 U/L    Comment: Performed at [facility], [address] [place], [place] [address]    No current facility-administered medications for this encounter.    Current Outpatient Medications  Medication Sig Dispense Refill  . butalbital-acetaminophen-caffeine (FIORICET, ESGIC) 50-325-40 MG tablet Take 1 tablet by mouth every 6 (six) hours as needed for headache. (Patient not taking: Reported on 04/14/2018) 20 tablet 0  . ibuprofen (ADVIL,MOTRIN) 600 MG tablet Take 1 tablet (600 mg total) by mouth every 6 (six) hours as needed. (Patient not taking: Reported on 04/14/2018) 30 tablet 0  . omeprazole (PRILOSEC) 40 MG capsule Take 1 capsule (40 mg total) by mouth daily. (Patient not taking: Reported on 04/14/2018) 30 capsule 0  . ondansetron (ZOFRAN) 4 MG tablet Take 1 tablet (4 mg total) by mouth daily as needed. (Patient not taking: Reported on 04/14/2018) 10 tablet 0  . oxyCODONE-acetaminophen (ROXICET) 5-325 MG tablet Take 1 tablet by mouth every 6 (six) hours as needed for severe pain. (Patient not taking: Reported on 04/14/2018) 6 tablet 0  . risperiDONE (RISPERDAL) 1 MG tablet Take 1 mg by mouth at bedtime.    . traMADol (ULTRAM) 50 MG tablet Take 1 tablet (50 mg total) by mouth every 6 (six) hours as needed. (Patient not taking: Reported on 04/14/2018) 12 tablet 0  . trimethoprim-polymyxin b (POLYTRIM) ophthalmic solution Place 1 drop into the left eye every 6 (six) hours. (Patient not taking: Reported on 04/14/2018) 10 mL 0     Musculoskeletal: Strength & Muscle Tone: within normal limits Gait & Station: normal Patient leans: N/A  Psychiatric Specialty Exam: Physical Exam  Nursing note and vitals reviewed. Constitutional: He appears well-developed and well-nourished.  HENT:  Head: Normocephalic and atraumatic.  Eyes: Pupils are equal, round, and reactive to light. Conjunctivae are normal.  Neck: Normal range of motion.  Cardiovascular: Regular rhythm and normal heart sounds.  Respiratory: Effort normal. No respiratory distress.  GI: Soft.  Musculoskeletal: Normal range of motion.  Neurological: He is alert.  Skin: Skin is warm and dry.  Psychiatric: His affect is blunt. His speech is delayed and tangential. He is slowed and withdrawn. Thought content is paranoid. Cognition and memory are impaired. He expresses impulsivity. He exhibits a depressed mood. He expresses suicidal ideation.    Review of Systems  Constitutional: Negative.   HENT: Negative.   Eyes: Negative.   Respiratory: Negative.   Cardiovascular: Negative.   Gastrointestinal: Negative.   Musculoskeletal: Negative.   Skin: Negative.   Neurological: Negative.  Psychiatric/Behavioral: Positive for depression, hallucinations, memory loss, substance abuse and suicidal ideas. The patient is nervous/anxious and has insomnia.     Blood pressure (!) 144/77, pulse 90, temperature 99.4 F (37.4 C), temperature source Oral, resp. rate 20, SpO2 95 %.There is no height or weight on file to calculate BMI.  General Appearance: Casual  Eye Contact:  Minimal  Speech:  Slow  Volume:  Decreased  Mood:  Depressed  Affect:  Constricted  Thought Process:  Coherent  Orientation:  Full (Time, Place, and Person)  Thought Content:  Rumination and Tangential  Suicidal Thoughts:  Yes.  without intent/plan  Homicidal Thoughts:  No  Memory:  Immediate;   Fair Recent;   Poor Remote;   Poor  Judgement:  Impaired  Insight:  Shallow  Psychomotor Activity:   Decreased  Concentration:  Concentration: Poor  Recall:  Poor  Fund of Knowledge:  Poor  Language:  Poor  Akathisia:  No  Handed:  Right  AIMS (if indicated):     Assets:  Desire for Improvement Housing Physical Health Resilience  ADL's:  Intact  Cognition:  Impaired,  Mild  Sleep:        Treatment Plan Summary: Daily contact with patient to assess and evaluate symptoms and progress in treatment, Medication management and Plan 32 year old man who continues to be very depressed seeming but also intermittently labile.  Recent suicidal thoughts and acting out.  Substance abuse.  Patient meets commitment criteria and would do best to be admitted to the hospital for safety and treatment.  Reviewed plan with patient and emergency room physician.  Orders will be placed.  Spoke to TTS.  Patient can get PRN medicine for withdrawal if needed and be restarted on psychiatric medicine for bipolar disorder.  Full set of labs will be obtained.  Disposition: Recommend psychiatric Inpatient admission when medically cleared.  Alethia Berthold, MD 04/14/2018 1:06 PM

## 2018-04-14 NOTE — Tx Team (Signed)
Initial Treatment Plan 04/14/2018 6:28 PM Patrick Sparks [id number]    PATIENT STRESSORS: Financial difficulties Substance abuse   PATIENT STRENGTHS: Ability for insight Active sense of humor Average or above average intelligence Capable of independent living Supportive family/friends   PATIENT IDENTIFIED PROBLEMS: Depression 04/14/18  Suicidal 04/14/18                   DISCHARGE CRITERIA:  Ability to meet basic life and health needs Motivation to continue treatment in a less acute level of care  PRELIMINARY DISCHARGE PLAN: Outpatient therapy Return to previous living arrangement Return to previous work or school arrangements  PATIENT/FAMILY INVOLVEMENT: This treatment plan has been presented to and reviewed with the patient, Patrick Sparks, and/or family member,   The patient and family have been given the opportunity to ask questions and make suggestions.  [name], RN 04/14/2018, 6:28 PM

## 2018-04-15 DIAGNOSIS — F1994 Other psychoactive substance use, unspecified with psychoactive substance-induced mood disorder: Principal | ICD-10-CM

## 2018-04-15 LAB — LIPID PANEL
CHOL/HDL RATIO: 3.3 ratio
Cholesterol: 229 mg/dL — ABNORMAL HIGH (ref 0–200)
HDL: 70 mg/dL (ref >40)
LDL CALC: 142 mg/dL — AB (ref 0–99)
TRIGLYCERIDES: 86 mg/dL (ref <150)
VLDL: 17 mg/dL (ref 0–40)

## 2018-04-15 LAB — TSH: TSH: 1.429 u[IU]/mL (ref 0.350–4.500)

## 2018-04-15 LAB — HEMOGLOBIN A1C
Hgb A1c MFr Bld: 4.3 % — ABNORMAL LOW (ref 4.8–5.6)
Mean Plasma Glucose: 76.71 mg/dL

## 2018-04-15 MED ORDER — OLANZAPINE 10 MG PO TABS: 10.0000 mg | ORAL_TABLET | Freq: Every day | ORAL | 0 refills | Status: DC

## 2018-04-15 MED ORDER — OLANZAPINE 10 MG PO TABS: 10.0000 mg | ORAL_TABLET | Freq: Every day | ORAL | Status: DC

## 2018-04-15 NOTE — Plan of Care (Addendum)
Patient found in day room upon my arrival. Patient is visible but not social this evening. Patient denies all complaints. Minimally verbal throughout assessment. Denies SI/HI/AVH. Complains of depression but will not elaborate. Patient is guarded but calm and compliant with unit routine and staff direction. Patient is appropriate and pleasant throughout the evening. Denies need for medication at this time. Patient has no scheduled HS medication. Q 15 minute checks maintained. Will continue to monitor throughout the shift. Patient slept 6.25 hours. No apparent distress. Will endorse care to oncoming shift.   Problem: Coping: Goal: Coping ability will improve Outcome: Progressing   Problem: Education: Goal: Knowledge of [facility] Education information/materials will improve Outcome: Progressing

## 2018-04-15 NOTE — Progress Notes (Signed)
  [facility] Discharge Plan :  Will you be returning to the same living situation after discharge:  Yes,  Home with mother At discharge, do you have transportation home?: Yes,  Patients girlfriend Do you have the ability to pay for your medications: Yes,  Referred to a provider who can assist  Release of information consent forms completed and in the chart;  Patient's signature needed at discharge.  Patient to Follow up at: Follow-up Information    [facility], [facility]. Go on 04/16/2018.   Why:  Please follow up with [facility] via walk-in clinic on Monday-Friday from 9am-4pm. Thank you.  Contact information: [address] [place] [id number] [phone]           Next level of care provider has access to [facility] Link:no  Safety Planning and Suicide Prevention discussed: Yes,  Completed with patient. Patient refused family contact  Have you used any form of tobacco in the last 30 days? (Cigarettes, Smokeless Tobacco, Cigars, and/or Pipes): Yes  Has patient been referred to the Quitline?: N/A patient is not a smoker  Patient has been referred for addiction treatment: Yes  Johny ShearsCassandra  Kamayah Pillay, LCSW 04/15/2018, 10:12 AM

## 2018-04-15 NOTE — Discharge Summary (Signed)
Physician Discharge Summary Note  Patient:  Patrick Sparks is an 32 y.o., male MRN:  [id number] DOB:  November 27, 1986 Patient phone:  [phone] (home)  Patient address:   [address] [place] [address],  Total Time spent with patient: 45 minutes  Date of Admission:  04/14/2018 Date of Discharge: 04/15/18  Reason for Admission:  Suicidal thoughts while intoxicated  Principal Problem: Substance induced mood disorder [facility](HCC) Discharge Diagnoses: Patient Active Problem List   Diagnosis Date Noted  . Substance induced mood disorder (HCC) [F19.94] 04/15/2018    Priority: High  . Alcohol abuse [F10.10] 04/14/2018    Priority: High  . Cocaine abuse (HCC) [F14.10] 04/14/2018    Priority: High  . Bipolar 1 disorder, depressed (HCC) [F31.9] 04/14/2018    Past Psychiatric History: See H&P  Past Medical History: History reviewed. No pertinent past medical history. History reviewed. No pertinent surgical history. Family History: History reviewed. No pertinent family history. Family Psychiatric  History: See H&P Social History:  Social History   Substance and Sexual Activity  Alcohol Use Yes     Social History   Substance and Sexual Activity  Drug Use Not on file    Social History   Socioeconomic History  . Marital status: Single    Spouse name: Not on file  . Number of children: Not on file  . Years of education: Not on file  . Highest education level: Not on file  Occupational History  . Not on file  Social Needs  . Financial resource strain: Not on file  . Food insecurity:    Worry: Not on file    Inability: Not on file  . Transportation needs:    Medical: Not on file    Non-medical: Not on file  Tobacco Use  . Smoking status: Current Every Day Smoker    Types: Cigarettes  . Smokeless tobacco: Never Used  Substance and Sexual Activity  . Alcohol use: Yes  . Drug use: Not on file  . Sexual activity: Not on file  Lifestyle  . Physical activity:   Days per week: Not on file    Minutes per session: Not on file  . Stress: Not on file  Relationships  . Social connections:    Talks on phone: Not on file    Gets together: Not on file    Attends religious service: Not on file    Active member of club or organization: Not on file    Attends meetings of clubs or organizations: Not on file    Relationship status: Not on file  Other Topics Concern  . Not on file  Social History Narrative  . Not on file    Hospital Course:  Pt was started on Zyprexa in the ED. He was given prescription for this. Pt was requesting discharge. He was calm and organized in thoughts. He adamantly denied SI or thoughts of self harm. Denied AH, VH, Paranoia, HI. He did not appear manic or psychotic. He did not have any unsafe behaviors on the unit. His mother felt safe with him coming home. HE was future oriented and planned to fill his prescription today. He greatly minimizes drug and alcohol use and declines any services for this. HE plans to follow up with [facility] because he felt he had a good experience there.   The patient is at low risk of imminent suicide. Patient denied thoughts, intent, or plan for harm to self or others, expressed significant future orientation, and expressed  an ability to mobilize assistance for his needs. he is presently void of any contributing psychiatric symptoms, cognitive difficulties, or substance use which would elevate his risk for lethality. Chronic risk for lethality is elevated in light of male gender, alcohol and crack use and very poor insight into this, impulsivity. The chronic risk is presently mitigated by his/her ongoing desire and engagement in [facility] treatment and mobilization of support from family and friends. Chronic risk may elevate if he experiences any significant loss or worsening of symptoms, which can be managed and monitored through outpatient providers. At this time,a cute risk for lethality is low and he is stable for  ongoing outpatient management.   Modifiable risk factors were addressed during this hospitalization through appropriate pharmacotherapy and establishment of outpatient follow-up treatment. Some risk factors for suicide are situational (i.e. Unstable housing) or related personality pathology (i.e. Poor coping mechanisms) and thus cannot be further mitigated by continued hospitalization in this setting.    Physical Findings: AIMS:  , ,  ,  ,    CIWA:    COWS:      Psychiatric Specialty Exam: See H&P from today   Have you used any form of tobacco in the last 30 days? (Cigarettes, Smokeless Tobacco, Cigars, and/or Pipes): Yes  Has this patient used any form of tobacco in the last 30 days? (Cigarettes, Smokeless Tobacco, Cigars, and/or Pipes) Yes, Yes, A prescription for an FDA-approved tobacco cessation medication was offered at discharge and the patient refused  Blood Alcohol level:  Lab Results  Component Value Date   ETH 200 (H) 04/14/2018   ETH 177 (H) 03/08/2017    Metabolic Disorder Labs:  Lab Results  Component Value Date   HGBA1C 4.3 (L) 04/15/2018   MPG 76.71 04/15/2018   No results found for: PROLACTIN Lab Results  Component Value Date   CHOL 229 (H) 04/15/2018   TRIG 86 04/15/2018   HDL 70 04/15/2018   CHOLHDL 3.3 04/15/2018   VLDL 17 04/15/2018   LDLCALC 142 (H) 04/15/2018    See Psychiatric Specialty Exam and Suicide Risk Assessment completed by Attending Physician prior to discharge.  Discharge destination:  Home  Is patient on multiple antipsychotic therapies at discharge:  No   Has Patient had three or more failed trials of antipsychotic monotherapy by history:  No  Recommended Plan for Multiple Antipsychotic Therapies: NA  Discharge Instructions    Increase activity slowly   Complete by:  As directed      Allergies as of 04/15/2018   No Known Allergies     Medication List    STOP taking these medications   butalbital-acetaminophen-caffeine  50-325-40 MG tablet Commonly known as:  FIORICET, ESGIC   ibuprofen 600 MG tablet Commonly known as:  ADVIL,MOTRIN   omeprazole 40 MG capsule Commonly known as:  PRILOSEC   ondansetron 4 MG tablet Commonly known as:  ZOFRAN   oxyCODONE-acetaminophen 5-325 MG tablet Commonly known as:  ROXICET   risperiDONE 1 MG tablet Commonly known as:  RISPERDAL   traMADol 50 MG tablet Commonly known as:  ULTRAM   trimethoprim-polymyxin b ophthalmic solution Commonly known as:  POLYTRIM     TAKE these medications     Indication  OLANZapine 10 MG tablet Commonly known as:  ZYPREXA Take 1 tablet (10 mg total) by mouth at bedtime.  Indication:  Schizophrenia      Follow-up Information    [facility], [employer]. Go on 04/16/2018.   Why:  Please follow up  with [facility] via walk-in clinic on Monday-Friday from 9am-4pm. Thank you.  Contact information: [address] [place] [address] [phone]           Follow-up recommendations:  [facility] Signed: Haskell Riling, MD 04/15/2018, 2:37 PM

## 2018-04-15 NOTE — H&P (Addendum)
Psychiatric Admission Assessment Adult  Patient Identification: Patrick Sparks MRN:  [id number] Date of Evaluation:  04/15/2018 Chief Complaint:  "I had another blackout" Principal Diagnosis: Substance induced mood disorder (HCC) Diagnosis:   Patient Active Problem List   Diagnosis Date Noted  . Substance induced mood disorder (HCC) [F19.94] 04/15/2018    Priority: High  . Alcohol abuse [F10.10] 04/14/2018    Priority: High  . Cocaine abuse (HCC) [F14.10] 04/14/2018    Priority: High  . Bipolar 1 disorder, depressed (HCC) [F31.9] 04/14/2018   History of Present Illness: 32 yo male admitted due to depression and hallucinations. He was agitated in ED and intoxicated on alcohol and possibly cocaine. He was apparently banging his head on things at home yelling for help. Once he sobered up, he stated that he felt better and was ready to leave. He was admitted for stabilization.  Today, pt is restless and wanting to go home. HE states that he came because "I had one of my blackouts and said I was going to kill myself." He states taht that tends to happen from time to time. He admits that he was drinking at that time but greatly minimizes this. He states, "I don't drink that much." BAL was 200. He denies using cocaine but when told his UDS was positive he states, "I think I used like 3 days ago." Per chart review, he has long history of heavy cocaine use. He has very poor insight into drug and alcohol use. He states that he was trying to go [facility] to get a prescription "for my bipolar schizophrenia" but they wouldn't give him one. He eventually went to [place] [facility] and they gave him a prescription but he went drinking instead of filling it. He is not sure what the medication was. He states, "I want to go and fill that today." He was given Zyprexa in the ED and he felt this was very helpful for him for sleep and his mood. He asks if he can have a prescription for this instead. He states that he slept  well last night. Denies feels depressed or manic. Denies AH, VH or paranoia but admits he has had these symptoms in the past. He adamantly denies SI or thought of self harm. He states that his girlfriend visited him last night and that helped him a lot. He states, "That made me really happy." He does have brighter affect and smiling during interview. He states that he has been off medications since 2017. He requesting discharge today. He is calm and pleasant and does not appear manic or psychotic. HE has not had any unsafe behaviors on the unit. Denies HI.  I spoke with his sister and mother who feel safe with him discharging home and he can stay with his mother.   Associated Signs/Symptoms: Depression Symptoms:  Denies (Hypo) Manic Symptoms:  Impulsivity, Anxiety Symptoms:  Denies Psychotic Symptoms:  Denies AH, VH, Paranioa PTSD Symptoms: Negative Total Time spent with patient: 45 minutes  Past Psychiatric History: Pt reports history of "bipolar schizophrenia" that he was recently diagnosed with at [facility]. He recently started seeing someone at [facility]. He denies past suicide attempts.   Is the patient at risk to self? No.  Has the patient been a risk to self in the past 6 months? Yes.    Has the patient been a risk to self within the distant past? Yes.    Is the patient a risk to others? No.  Has the patient been a risk  to others in the past 6 months? No.  Has the patient been a risk to others within the distant past? No.   Alcohol Screening: 1. How often do you have a drink containing alcohol?: 4 or more times a week 2. How many drinks containing alcohol do you have on a typical day when you are drinking?: 5 or 6 3. How often do you have six or more drinks on one occasion?: Weekly AUDIT-C Score: 9 4. How often during the last year have you found that you were not able to stop drinking once you had started?: Never 5. How often during the last year have you failed to do what was normally  expected from you becasue of drinking?: Never 6. How often during the last year have you needed a first drink in the morning to get yourself going after a heavy drinking session?: Never 7. How often during the last year have you had a feeling of guilt of remorse after drinking?: Never 8. How often during the last year have you been unable to remember what happened the night before because you had been drinking?: Never 9. Have you or someone else been injured as a result of your drinking?: No 10. Has a relative or friend or a doctor or another health worker been concerned about your drinking or suggested you cut down?: No Alcohol Use Disorder Identification Test Final Score (AUDIT): 9 Intervention/Follow-up: Continued Monitoring, Alcohol Education Substance Abuse History in the last 12 months:  Yes.  , alcohol and crack cocaine Consequences of Substance Abuse: Blackouts:  yes Previous Psychotropic Medications: Yes  Psychological Evaluations: Yes  Past Medical History: History reviewed. No pertinent past medical history. History reviewed. No pertinent surgical history. Family History: History reviewed. No pertinent family history. Family Psychiatric  History: Unknown Tobacco Screening: Have you used any form of tobacco in the last 30 days? (Cigarettes, Smokeless Tobacco, Cigars, and/or Pipes): Yes Tobacco use, Select all that apply: 5 or more cigarettes per day Are you interested in Tobacco Cessation Medications?: Yes, will notify MD for an order Counseled patient on smoking cessation including recognizing danger situations, developing coping skills and basic information about quitting provided: Refused/Declined practical counseling Social History: Lives with his mother. He is not working right now and is trying to file for disability. He has 1 27 yo daughter who lives with her mother.  Social History   Substance and Sexual Activity  Alcohol Use Yes     Social History   Substance and Sexual  Activity  Drug Use Not on file    Additional Social History: Marital status: Single Are you sexually active?: Yes What is your sexual orientation?: Heterosexual Has your sexual activity been affected by drugs, alcohol, medication, or emotional stress?: N/A Does patient have children?: Yes How many children?: 1 How is patient's relationship with their children?: Good relationship                         Allergies:  No Known Allergies Lab Results:  Results for orders placed or performed during the hospital encounter of 04/14/18 (from the past 48 hour(s))  Hemoglobin A1c     Status: Abnormal   Collection Time: 04/15/18  6:26 AM  Result Value Ref Range   Hgb A1c MFr Bld 4.3 (L) 4.8 - 5.6 %    Comment: (NOTE) Pre diabetes:          5.7%-6.4% Diabetes:              >  6.4% Glycemic control for   <7.0% adults with diabetes    Mean Plasma Glucose 76.71 mg/dL    Comment: Performed at [facility], [address] [place], [place] [address]  Lipid panel     Status: Abnormal   Collection Time: 04/15/18  6:26 AM  Result Value Ref Range   Cholesterol 229 (H) 0 - 200 mg/dL   Triglycerides 86 <295<150 mg/dL   HDL 70 >62>40 mg/dL   Total CHOL/HDL Ratio 3.3 RATIO   VLDL 17 0 - 40 mg/dL   LDL Cholesterol 130142 (H) 0 - 99 mg/dL    Comment:        Total Cholesterol/HDL:CHD Risk Coronary Heart Disease Risk Table                     Men   Women  1/2 Average Risk   3.4   3.3  Average Risk       5.0   4.4  2 X Average Risk   9.6   7.1  3 X Average Risk  23.4   11.0        Use the calculated Patient Ratio above and the CHD Risk Table to determine the patient's CHD Risk.        ATP III CLASSIFICATION (LDL):  <100     mg/dL   Optimal  [id number]  mg/dL   Near or Above                    Optimal  130-159  mg/dL   Borderline  [id number]  mg/dL   High  >284>190     mg/dL   Very High Performed at [facility], [address] [place], [place] [address]   TSH     Status: None    Collection Time: 04/15/18  6:26 AM  Result Value Ref Range   TSH 1.429 0.350 - 4.500 uIU/mL    Comment: Performed by a 3rd Generation assay with a functional sensitivity of <=0.01 uIU/mL. Performed at [facility], [address] [place], [place] [address]     Blood Alcohol level:  Lab Results  Component Value Date   ETH 200 (H) 04/14/2018   ETH 177 (H) 03/08/2017    Metabolic Disorder Labs:  Lab Results  Component Value Date   HGBA1C 4.3 (L) 04/15/2018   MPG 76.71 04/15/2018   No results found for: PROLACTIN Lab Results  Component Value Date   CHOL 229 (H) 04/15/2018   TRIG 86 04/15/2018   HDL 70 04/15/2018   CHOLHDL 3.3 04/15/2018   VLDL 17 04/15/2018   LDLCALC 142 (H) 04/15/2018    Current Medications: Current Facility-Administered Medications  Medication Dose Route Frequency Provider Last Rate Last Dose  . acetaminophen (TYLENOL) tablet 650 mg  650 mg Oral Q6H PRN Clapacs, John T, MD      . alum & mag hydroxide-simeth (MAALOX/MYLANTA) 200-200-20 MG/5ML suspension 30 mL  30 mL Oral Q4H PRN Clapacs, John T, MD      . hydrOXYzine (ATARAX/VISTARIL) tablet 25 mg  25 mg Oral TID PRN Clapacs, John T, MD      . magnesium hydroxide (MILK OF MAGNESIA) suspension 30 mL  30 mL Oral Daily PRN Clapacs, John T, MD      . OLANZapine (ZYPREXA) tablet 10 mg  10 mg Oral QHS Baila Rouse R, MD      . traZODone (DESYREL) tablet 100 mg  100 mg Oral QHS PRN Clapacs,  Jackquline Denmark, MD       PTA Medications: Medications Prior to Admission  Medication Sig Dispense Refill Last Dose  . butalbital-acetaminophen-caffeine (FIORICET, ESGIC) 50-325-40 MG tablet Take 1 tablet by mouth every 6 (six) hours as needed for headache. (Patient not taking: Reported on 04/14/2018) 20 tablet 0 Not Taking at Unknown time  . ibuprofen (ADVIL,MOTRIN) 600 MG tablet Take 1 tablet (600 mg total) by mouth every 6 (six) hours as needed. (Patient not taking: Reported on 04/14/2018) 30 tablet 0 Not Taking at Unknown  time  . omeprazole (PRILOSEC) 40 MG capsule Take 1 capsule (40 mg total) by mouth daily. (Patient not taking: Reported on 04/14/2018) 30 capsule 0 Not Taking at Unknown time  . ondansetron (ZOFRAN) 4 MG tablet Take 1 tablet (4 mg total) by mouth daily as needed. (Patient not taking: Reported on 04/14/2018) 10 tablet 0 Not Taking at Unknown time  . oxyCODONE-acetaminophen (ROXICET) 5-325 MG tablet Take 1 tablet by mouth every 6 (six) hours as needed for severe pain. (Patient not taking: Reported on 04/14/2018) 6 tablet 0 Not Taking at Unknown time  . risperiDONE (RISPERDAL) 1 MG tablet Take 1 mg by mouth at bedtime.   Not Taking at Unknown time  . traMADol (ULTRAM) 50 MG tablet Take 1 tablet (50 mg total) by mouth every 6 (six) hours as needed. (Patient not taking: Reported on 04/14/2018) 12 tablet 0 Not Taking at Unknown time  . trimethoprim-polymyxin b (POLYTRIM) ophthalmic solution Place 1 drop into the left eye every 6 (six) hours. (Patient not taking: Reported on 04/14/2018) 10 mL 0 Not Taking at Unknown time    Musculoskeletal: Strength & Muscle Tone: within normal limits Gait & Station: normal Patient leans: N/A  Psychiatric Specialty Exam: Physical Exam  ROS  Blood pressure 124/84, pulse 71, temperature 97.7 F (36.5 C), temperature source Oral, resp. rate 18, height 5\' 8"  (1.727 m), weight 103.4 kg (228 lb), SpO2 100 %.Body mass index is 34.67 kg/m.  General Appearance: Casual  Eye Contact:  Fair  Speech:  Clear and Coherent  Volume:  Normal  Mood:  Euthymic  Affect:  Appropriate  Thought Process:  Coherent and Goal Directed  Orientation:  Full (Time, Place, and Person)  Thought Content:  Logical  Suicidal Thoughts:  No  Homicidal Thoughts:  No  Memory:  Immediate;   Fair  Judgement:  Fair  Insight:  Lacking  Psychomotor Activity:  Normal  Concentration:  Concentration: Fair  Recall:  [employer] of Knowledge:  Fair  Language:  Fair  Akathisia:  No      Assets:  Resilience   ADL's:  Intact  Cognition:  WNL  Sleep:  Number of Hours: 6.25    Treatment Plan Summary: 32 yo male admitted due to suicidal thoughts while intoxicated. Now that he is sober he denies all symptoms. He has not had any unsafe behaviors on the unit and is pressing hard for discharge. He does not appear psychotic or manic. He has very poor insight into alcohol and drug use and declines any resources. He does have history of paranoia but also long history of drug use so difficult to ascertain what his true diagnosis is as he has not had extensive sobriety. He does not meet IVC criteria. He plans to follow up with [facility] and fill his prescription. He requests script for Zyprexa. He has been able to reach out for help in the past when needed and agrees to return if he needs to.  Plan:  Substance induced mood disorder -Zyprexa 10 mg qhs  Dispo -Discharge today. I spoke with his mother and sister who feel safe with him discharging home.     I certify that inpatient services furnished can reasonably be expected to improve the patient's condition.    Haskell Riling, MD 4/18/201910:11 AM

## 2018-04-15 NOTE — [facility] Suicide Risk Assessment (Signed)
[facility]   Principal Problem: <principal problem not specified> Discharge Diagnoses:  Patient Active Problem List   Diagnosis Date Noted  . Bipolar 1 disorder, depressed (HCC) [F31.9] 04/14/2018  . Alcohol abuse [F10.10] 04/14/2018  . Cocaine abuse (HCC) [F14.10] 04/14/2018    Mental Status Per Nursing Assessment::   On Admission:     Demographic Factors:  Male  Loss Factors: NA  Historical Factors: Impulsivity  Risk Reduction Factors:   Living with another person, especially a relative and Positive social support  Continued Clinical Symptoms:  Alcohol/Substance Abuse/Dependencies  Cognitive Features That Contribute To Risk:  None    Suicide Risk:  Minimal Acute Risk: No identifiable suicidal ideation.      Plan Of Care/Follow-up recommendations:  [facility]  Holly R McNew, MD 04/15/2018, 10:06 AM

## 2018-04-15 NOTE — Progress Notes (Signed)
Denies SI/HI/AVH.   Discharge instructions given, verbalized understanding.  Prescriptions given and personal belongings returned.  Escorted off unit by this Clinical research associatewriter to bus stop to travel home.

## 2018-04-15 NOTE — [facility] Suicide Risk Assessment (Signed)
[facility] INPATIENT:  Family/Significant Other Suicide Prevention Education  Suicide Prevention Education:  Patient Refusal for Family/Significant Other Suicide Prevention Education: The patient Patrick Sparks has refused to provide written consent for family/significant other to be provided Family/Significant Other Suicide Prevention Education during admission and/or prior to discharge.  Physician notified.  Johny ShearsCassandra  Rush Salce 04/15/2018, 10:12 AM

## 2018-10-24 ENCOUNTER — Encounter: Payer: Self-pay | Admitting: Emergency Medicine

## 2018-10-24 ENCOUNTER — Other Ambulatory Visit: Payer: Self-pay

## 2018-10-24 ENCOUNTER — Emergency Department: Payer: Self-pay

## 2018-10-24 ENCOUNTER — Emergency Department
Admission: EM | Admit: 2018-10-24 | Discharge: 2018-10-24 | Disposition: A | Discharge status: Home / Self Care | Payer: Self-pay | Attending: Emergency Medicine | Admitting: Emergency Medicine

## 2018-10-24 DIAGNOSIS — F17210 Nicotine dependence, cigarettes, uncomplicated: Secondary | ICD-10-CM | POA: Insufficient documentation

## 2018-10-24 DIAGNOSIS — J209 Acute bronchitis, unspecified: Secondary | ICD-10-CM | POA: Insufficient documentation

## 2018-10-24 DIAGNOSIS — J40 Bronchitis, not specified as acute or chronic: Secondary | ICD-10-CM

## 2018-10-24 DIAGNOSIS — Z79899 Other long term (current) drug therapy: Secondary | ICD-10-CM | POA: Insufficient documentation

## 2018-10-24 HISTORY — DX: Bipolar disorder, unspecified: F31.9

## 2018-10-24 HISTORY — DX: Schizophrenia, unspecified: F20.9

## 2018-10-24 LAB — COMPREHENSIVE METABOLIC PANEL
ALBUMIN: 4.1 g/dL (ref 3.5–5.0)
ALT: 24 U/L (ref 0–44)
ANION GAP: 16 — AB (ref 5–15)
AST: 27 U/L (ref 15–41)
Alkaline Phosphatase: 49 U/L (ref 38–126)
BUN: 8 mg/dL (ref 6–20)
CALCIUM: 9.4 mg/dL (ref 8.9–10.3)
CO2: 20 mmol/L — AB (ref 22–32)
Chloride: 101 mmol/L (ref 98–111)
Creatinine, Ser: 0.89 mg/dL (ref 0.61–1.24)
GFR calc Af Amer: >60 mL/min (ref >60)
GFR calc non Af Amer: >60 mL/min (ref >60)
GLUCOSE: 84 mg/dL (ref 70–99)
POTASSIUM: 3.8 mmol/L (ref 3.5–5.1)
SODIUM: 137 mmol/L (ref 135–145)
TOTAL PROTEIN: 8.7 g/dL — AB (ref 6.5–8.1)
Total Bilirubin: 0.9 mg/dL (ref 0.3–1.2)

## 2018-10-24 LAB — CBC
HCT: 45.8 % (ref 39.0–52.0)
HEMOGLOBIN: 15.5 g/dL (ref 13.0–17.0)
MCH: 29.9 pg (ref 26.0–34.0)
MCHC: 33.8 g/dL (ref 30.0–36.0)
MCV: 88.4 fL (ref 80.0–100.0)
NRBC: 0 % (ref 0.0–0.2)
Platelets: 231 10*3/uL (ref 150–400)
RBC: 5.18 MIL/uL (ref 4.22–5.81)
RDW: 11.1 % — AB (ref 11.5–15.5)
WBC: 8.5 10*3/uL (ref 4.0–10.5)

## 2018-10-24 LAB — TROPONIN I

## 2018-10-24 MED ORDER — SODIUM CHLORIDE 0.9 % IV BOLUS
1000.0000 mL | Freq: Once | INTRAVENOUS | Status: AC
  Administered 2018-10-24: 1000 mL via INTRAVENOUS

## 2018-10-24 MED ORDER — HYDROCOD POLST-CPM POLST ER 10-8 MG/5ML PO SUER
5.0000 mL | Freq: Once | ORAL | Status: AC
  Administered 2018-10-24: 5 mL via ORAL
  Filled 2018-10-24: qty 5

## 2018-10-24 MED ORDER — AZITHROMYCIN 250 MG PO TABS: 250.0000 mg | ORAL_TABLET | Freq: Every day | ORAL | 0 refills | Status: DC

## 2018-10-24 MED ORDER — GUAIFENESIN-CODEINE 100-10 MG/5ML PO SOLN: 5.0000 mL | Freq: Four times a day (QID) | ORAL | 0 refills | Status: DC | PRN

## 2018-10-24 MED ORDER — AZITHROMYCIN 500 MG PO TABS
500.0000 mg | ORAL_TABLET | Freq: Once | ORAL | Status: AC
  Administered 2018-10-24: 500 mg via ORAL
  Filled 2018-10-24: qty 1

## 2018-10-24 NOTE — ED Notes (Signed)
Pt held for discharge pending fluids

## 2018-10-24 NOTE — ED Notes (Signed)
Patient transported to X-ray 

## 2018-10-24 NOTE — ED Notes (Signed)
FIRST NURSE NOTE: pt comes into the ED via EMS from home with c/o cough with congestion and increased SOB/painful breathing on the right for the past 2 weeks. EMS reports giving the pt x1 Albuterol, x1 duoneb, #20 LAC with zofran 4mg  for nausea in route. Pt has neb treatment running on arrival to the [facility].

## 2018-10-24 NOTE — ED Provider Notes (Signed)
[facility] Emergency Department Provider Note  Time seen: 11:58 AM  I have reviewed the triage vital signs and the nursing notes.   HISTORY  Chief Complaint Shortness of Breath    HPI Patrick Sparks is a 32 y.o. male a past medical history of bipolar, schizophrenia, presents to the emergency department for cough and shortness of breath.  According to the patient for the past 1.5 weeks he has been coughing with occasional sputum production, denies any fever.  Does state intermittent nausea.  States over the past 2 to 3 days he is now developed some pain in his posterior chest from coughing.  Denies any asthma or COPD.   Past Medical History:  Diagnosis Date  . Bipolar 1 disorder (HCC)   . Schizophrenia [facility])     Patient Active Problem List   Diagnosis Date Noted  . Substance induced mood disorder (HCC) 04/15/2018  . Bipolar 1 disorder, depressed (HCC) 04/14/2018  . Alcohol abuse 04/14/2018  . Cocaine abuse (HCC) 04/14/2018    History reviewed. No pertinent surgical history.  Prior to Admission medications   Medication Sig Start Date End Date Taking? Authorizing Provider  OLANZapine (ZYPREXA) 10 MG tablet Take 1 tablet (10 mg total) by mouth at bedtime. 04/15/18   McNew, Ileene Hutchinson, MD    No Known Allergies  No family history on file.  Social History Social History   Tobacco Use  . Smoking status: Current Every Day Smoker    Types: Cigarettes  . Smokeless tobacco: Never Used  Substance Use Topics  . Alcohol use: Yes  . Drug use: Yes    Types: Cocaine    Comment: Last used 2 days ago    Review of Systems Constitutional: Negative for fever. ENT: Positive for congestion x1.5 weeks Cardiovascular: Posterior chest pain with cough. Respiratory: Mild shortness of breath.  Frequent cough. Gastrointestinal: Negative for abdominal pain Musculoskeletal: Negative for leg pain or swelling Skin: Negative for skin complaints  Neurological:  Negative for headache All other ROS negative  ____________________________________________   PHYSICAL EXAM:  VITAL SIGNS: ED Triage Vitals  Enc Vitals Group     BP 10/24/18 1016 112/60     Pulse Rate 10/24/18 1016 92     Resp 10/24/18 1016 20     Temp 10/24/18 1016 97.8 F (36.6 C)     Temp Source 10/24/18 1016 Oral     SpO2 10/24/18 1016 100 %     Weight 10/24/18 1017 230 lb (104.3 kg)     Height 10/24/18 1017 5\' 8"  (1.727 m)     Head Circumference --      Peak Flow --      Pain Score 10/24/18 1016 10     Pain Loc --      Pain Edu? --      Excl. in GC? --    Constitutional: Alert and oriented. Well appearing and in no distress. Eyes: Normal exam ENT   Head: Normocephalic and atraumatic   Mouth/Throat: Mucous membranes are moist. Cardiovascular: Normal rate, regular rhythm. No murmur Respiratory: Normal respiratory effort without tachypnea nor retractions. Breath sounds are clear  Gastrointestinal: Soft and nontender. No distention.  Musculoskeletal: Nontender with normal range of motion in all extremities. No lower extremity tenderness or edema. Neurologic:  Normal speech and language. No gross focal neurologic deficits Skin:  Skin is warm, dry and intact.  Psychiatric: Mood and affect are normal.   ____________________________________________    EKG  EKG reviewed and  interpreted by myself shows a normal sinus rhythm 88 bpm with a narrow QRS, normal axis, normal intervals, nonspecific ST changes.  Some J pointing consistent with early repolarization.  ____________________________________________    RADIOLOGY  X-ray consistent with bronchitis.  ____________________________________________   INITIAL IMPRESSION / ASSESSMENT AND PLAN / ED COURSE  Pertinent labs & imaging results that were available during my care of the patient were reviewed by me and considered in my medical decision making (see chart for details).  Patient presents to the emergency  department 1.5 weeks of cough now with posterior chest pain with cough, congestion and shortness of breath.  Differential would include upper respiratory infection, acute bronchitis, pneumonia, viral infection, influenza, ACS.  We will check labs including cardiac enzymes, obtain a chest x-ray and continue to closely monitor.  Given 1.5 weeks of cough congestion and shortness of breath we will cover with Zithromax for possible bronchitis, we will dose Tussionex in the emergency department and continue to closely monitor.  Currently awaiting chest x-ray and lab results.  Bronchitic changes on x-ray.  Will cover with Zithromax and cough medication.  Patient will follow-up with primary care doctor.  Labs are reassuring.  ____________________________________________   FINAL CLINICAL IMPRESSION(S) / ED DIAGNOSES  dyspnea bronchits    Minna Antis, MD 10/24/18 1244

## 2018-10-24 NOTE — ED Triage Notes (Signed)
pt comes into the ED via EMS from home with c/o cough with congestion and increased SOB/painful breathing on the right for the past 2 weeks. EMS reports giving the pt x1 Albuterol, x1 duoneb, #20 LAC with zofran 4mg  for nausea in route. Pt has neb treatment running on arrival to the [facility]. EMS reports 92% on RA.

## 2018-10-25 ENCOUNTER — Other Ambulatory Visit: Payer: Self-pay

## 2018-10-25 DIAGNOSIS — J40 Bronchitis, not specified as acute or chronic: Secondary | ICD-10-CM | POA: Insufficient documentation

## 2018-10-25 DIAGNOSIS — F17210 Nicotine dependence, cigarettes, uncomplicated: Secondary | ICD-10-CM | POA: Insufficient documentation

## 2018-10-25 DIAGNOSIS — R0602 Shortness of breath: Secondary | ICD-10-CM | POA: Insufficient documentation

## 2018-10-25 MED ORDER — ALBUTEROL SULFATE (2.5 MG/3ML) 0.083% IN NEBU
2.5000 mg | INHALATION_SOLUTION | Freq: Once | RESPIRATORY_TRACT | Status: AC
  Administered 2018-10-26: 2.5 mg via RESPIRATORY_TRACT
  Filled 2018-10-25: qty 3

## 2018-10-25 NOTE — ED Triage Notes (Signed)
Patient reports being short of breath.  Seen in ED and dix'd with bronchitis yesterday.  Patient states he is worse.

## 2018-10-25 NOTE — ED Notes (Signed)
Patient reports feeling worse.  Vital signs rechecked and breathing treatment given.

## 2018-10-25 NOTE — ED Notes (Signed)
Spoke with Dr. Don Perking regarding patient, no further orders other than EKG at this time.

## 2018-10-26 ENCOUNTER — Emergency Department
Admission: EM | Admit: 2018-10-26 | Discharge: 2018-10-26 | Disposition: A | Discharge status: Home / Self Care | Payer: Self-pay | Attending: Emergency Medicine | Admitting: Emergency Medicine

## 2018-10-26 ENCOUNTER — Emergency Department: Payer: Self-pay

## 2018-10-26 DIAGNOSIS — R0602 Shortness of breath: Secondary | ICD-10-CM

## 2018-10-26 DIAGNOSIS — J40 Bronchitis, not specified as acute or chronic: Secondary | ICD-10-CM

## 2018-10-26 LAB — CBC WITH DIFFERENTIAL/PLATELET
Abs Immature Granulocytes: 0.05 10*3/uL (ref 0.00–0.07)
BASOS ABS: 0 10*3/uL (ref 0.0–0.1)
BASOS PCT: 0 %
EOS ABS: 0.1 10*3/uL (ref 0.0–0.5)
Eosinophils Relative: 1 %
HCT: 46 % (ref 39.0–52.0)
Hemoglobin: 15.2 g/dL (ref 13.0–17.0)
IMMATURE GRANULOCYTES: 1 %
LYMPHS ABS: 1.7 10*3/uL (ref 0.7–4.0)
Lymphocytes Relative: 19 %
MCH: 30.2 pg (ref 26.0–34.0)
MCHC: 33 g/dL (ref 30.0–36.0)
MCV: 91.3 fL (ref 80.0–100.0)
Monocytes Absolute: 0.6 10*3/uL (ref 0.1–1.0)
Monocytes Relative: 6 %
NEUTROS PCT: 73 %
NRBC: 0 % (ref 0.0–0.2)
Neutro Abs: 6.4 10*3/uL (ref 1.7–7.7)
PLATELETS: 212 10*3/uL (ref 150–400)
RBC: 5.04 MIL/uL (ref 4.22–5.81)
RDW: 11.5 % (ref 11.5–15.5)
WBC: 8.8 10*3/uL (ref 4.0–10.5)

## 2018-10-26 LAB — COMPREHENSIVE METABOLIC PANEL
ALK PHOS: 43 U/L (ref 38–126)
ALT: 24 U/L (ref 0–44)
AST: 22 U/L (ref 15–41)
Albumin: 3.9 g/dL (ref 3.5–5.0)
Anion gap: 11 (ref 5–15)
BILIRUBIN TOTAL: 1.1 mg/dL (ref 0.3–1.2)
BUN: 8 mg/dL (ref 6–20)
CALCIUM: 8.8 mg/dL — AB (ref 8.9–10.3)
CO2: 25 mmol/L (ref 22–32)
Chloride: 104 mmol/L (ref 98–111)
Creatinine, Ser: 0.83 mg/dL (ref 0.61–1.24)
GFR calc Af Amer: >60 mL/min (ref >60)
Glucose, Bld: 111 mg/dL — ABNORMAL HIGH (ref 70–99)
POTASSIUM: 3.6 mmol/L (ref 3.5–5.1)
Sodium: 140 mmol/L (ref 135–145)
TOTAL PROTEIN: 8.8 g/dL — AB (ref 6.5–8.1)

## 2018-10-26 LAB — INFLUENZA PANEL BY PCR (TYPE A & B)
Influenza A By PCR: NEGATIVE
Influenza B By PCR: NEGATIVE

## 2018-10-26 LAB — BRAIN NATRIURETIC PEPTIDE: B NATRIURETIC PEPTIDE 5: 9 pg/mL (ref 0.0–100.0)

## 2018-10-26 LAB — TROPONIN I

## 2018-10-26 MED ORDER — IPRATROPIUM-ALBUTEROL 0.5-2.5 (3) MG/3ML IN SOLN
3.0000 mL | Freq: Once | RESPIRATORY_TRACT | Status: AC
  Administered 2018-10-26: 3 mL via RESPIRATORY_TRACT
  Filled 2018-10-26: qty 3

## 2018-10-26 MED ORDER — DEXAMETHASONE SODIUM PHOSPHATE 10 MG/ML IJ SOLN
10.0000 mg | Freq: Once | INTRAMUSCULAR | Status: AC
  Administered 2018-10-26: 10 mg via INTRAVENOUS
  Filled 2018-10-26: qty 1

## 2018-10-26 MED ORDER — LORAZEPAM 2 MG/ML IJ SOLN
1.0000 mg | Freq: Once | INTRAMUSCULAR | Status: AC
  Administered 2018-10-26: 1 mg via INTRAVENOUS
  Filled 2018-10-26: qty 1

## 2018-10-26 MED ORDER — ALBUTEROL SULFATE HFA 108 (90 BASE) MCG/ACT IN AERS: INHALATION_SPRAY | RESPIRATORY_TRACT | 1 refills | Status: DC

## 2018-10-26 MED ORDER — AZITHROMYCIN 250 MG PO TABS: ORAL_TABLET | ORAL | 0 refills | Status: DC

## 2018-10-26 NOTE — ED Provider Notes (Signed)
[facility] Emergency Department Provider Note  ____________________________________________   First MD Initiated Contact with Patient 10/26/18 0104     (approximate)  I have reviewed the triage vital signs and the nursing notes.   HISTORY  Chief Complaint Shortness of Breath    HPI Patrick Sparks is a 32 y.o. male with medical/psychiatric history as listed below who presents by private vehicle for evaluation of persistent shortness of breath.  He states it started about 3 days ago.  He came to the emergency department 2 days ago and was diagnosed with bronchitis.  He says his symptoms are just getting worse and he is having pain in the right side of his ribs and it is getting harder hard for him to breathe.  He is speaking in full sentences but he is breathing rapidly and is getting increasingly agitated.  He feels that he has pneumonia and is coughing up phlegm.  He says that he did not have time to get the prescription for azithromycin filled that he was given by Dr. Lenard Lance.  He got a breathing treatment in triage but said it did not help him.  He is agitated, cursing and nurses, and demanding immediate help.  He reports subjective fever.  He denies abdominal pain, nausea or vomiting, and dysuria.  He has no history of blood clots in the legs of the lungs.  He says he smokes tobacco but has not smoked for a couple of days because the shortness of breath.  He is having some pain in the right side of his  chest associated with shortness of breath.  Past Medical History:  Diagnosis Date  . Bipolar 1 disorder (HCC)   . Schizophrenia [facility])     Patient Active Problem List   Diagnosis Date Noted  . Substance induced mood disorder (HCC) 04/15/2018  . Bipolar 1 disorder, depressed (HCC) 04/14/2018  . Alcohol abuse 04/14/2018  . Cocaine abuse (HCC) 04/14/2018    No past surgical history on file.  Prior to Admission medications   Medication  Sig Start Date End Date Taking? Authorizing Provider  albuterol (PROVENTIL HFA;VENTOLIN HFA) 108 (90 Base) MCG/ACT inhaler Inhale 2-4 puffs by mouth every 4 hours as needed for wheezing, cough, and/or shortness of breath 10/26/18   Loleta Rose, MD  azithromycin (ZITHROMAX) 250 MG tablet Take 2 tablets PO on day 1, then take 1 tablet PO daily for 4 more days 10/26/18   Loleta Rose, MD  guaiFENesin-codeine 100-10 MG/5ML syrup Take 5 mLs by mouth every 6 (six) hours as needed for cough. Patient not taking: Reported on 10/26/2018 10/24/18   Minna Antis, MD  OLANZapine (ZYPREXA) 10 MG tablet Take 1 tablet (10 mg total) by mouth at bedtime. Patient not taking: Reported on 10/26/2018 04/15/18   Haskell Riling, MD    Allergies Patient has no known allergies.  No family history on file.  Social History Social History   Tobacco Use  . Smoking status: Current Every Day Smoker    Types: Cigarettes  . Smokeless tobacco: Never Used  Substance Use Topics  . Alcohol use: Yes  . Drug use: Yes    Types: Cocaine    Comment: Last used 2 days ago    Review of Systems Constitutional: Subjective fever, no chills Eyes: No visual changes. ENT: No sore throat. Cardiovascular: Pain in the right chest wall. Respiratory: +shortness of breath, +cough Gastrointestinal: No abdominal pain.  No nausea, no vomiting.  No diarrhea.  No constipation.  Genitourinary: Negative for dysuria. Musculoskeletal: Negative for neck pain.  Negative for back pain. Integumentary: Negative for rash. Neurological: Negative for headaches, focal weakness or numbness.   ____________________________________________   PHYSICAL EXAM:  VITAL SIGNS: ED Triage Vitals  Enc Vitals Group     BP 10/25/18 2211 (!) 143/68     Pulse Rate 10/25/18 2211 90     Resp 10/25/18 2211 20     Temp 10/25/18 2211 97.8 F (36.6 C)     Temp Source 10/25/18 2211 Oral     SpO2 10/25/18 2211 96 %     Weight --      Height --       Head Circumference --      Peak Flow --      Pain Score 10/25/18 2212 10     Pain Loc --      Pain Edu? --      Excl. in GC? --     Constitutional: Alert and oriented.  Agitated and anxious. Eyes: Conjunctivae are normal.  Head: Atraumatic. Nose: No congestion/rhinnorhea. Mouth/Throat: Mucous membranes are moist. Neck: No stridor.  No meningeal signs.   Cardiovascular: Normal rate, regular rhythm. Good peripheral circulation. Grossly normal heart sounds. Respiratory: Increased respiratory rate.  No wheezing, some coarse breath sounds throughout bilaterally.  Patient is minimally cooperative with exam. Gastrointestinal: Soft and nontender. No distention.  Musculoskeletal: No lower extremity tenderness nor edema. No gross deformities of extremities. Neurologic:  Normal speech and language. No gross focal neurologic deficits are appreciated.  Skin:  Skin is warm, dry and intact. No rash noted. Psychiatric: Mood and affect are verbally aggressive and difficult.  Argumentative, cursing at staff, demanding help and wants to know "what kind of a hospital is this" because he is still feeling short of breath. ____________________________________________   LABS (all labs ordered are listed, but only abnormal results are displayed)  Labs Reviewed  COMPREHENSIVE METABOLIC PANEL - Abnormal; Notable for the following components:      Result Value   Glucose, Bld 111 (*)    Calcium 8.8 (*)    Total Protein 8.8 (*)    All other components within normal limits  CBC WITH DIFFERENTIAL/PLATELET  BRAIN NATRIURETIC PEPTIDE  TROPONIN I  INFLUENZA PANEL BY PCR (TYPE A & B)   ____________________________________________  EKG  ED ECG REPORT I, Loleta Rose, the attending physician, personally viewed and interpreted this ECG.  Date: 10/25/2018 EKG Time: 22: 13 Rate: 94 Rhythm: normal sinus rhythm QRS Axis: normal Intervals: normal ST/T Wave abnormalities: normal Narrative Interpretation: no  evidence of acute ischemia  ____________________________________________  RADIOLOGY I, Loleta Rose, personally viewed and evaluated these images (plain radiographs) as part of my medical decision making, as well as reviewing the written report by the radiologist.  ED MD interpretation: No indication of acute or emergent medical condition identified on chest x-ray  Official radiology report(s): Dg Chest 2 View  Result Date: 10/26/2018 CLINICAL DATA:  32 year old male with shortness of breath EXAM: CHEST - 2 VIEW COMPARISON:  Chest radiograph dated 10/24/2018 FINDINGS: Shallow inspiration with bibasilar atelectasis. Infiltrate is less likely. Clinical correlation is recommended. There is no pleural effusion or pneumothorax. The cardiac silhouette is within normal limits. No acute osseous pathology. IMPRESSION: Shallow inspiration with bibasilar atelectasis. Infiltrate is less likely. Electronically Signed   By: Elgie Collard M.D.   On: 10/26/2018 02:22    ____________________________________________   PROCEDURES  Critical Care performed: No   Procedure(s) performed:   Procedures  ____________________________________________   INITIAL IMPRESSION / ASSESSMENT AND PLAN / ED COURSE  As part of my medical decision making, I reviewed the following data within the electronic MEDICAL RECORD NUMBER Nursing notes reviewed and incorporated, Labs reviewed , EKG interpreted , Old chart reviewed, Radiograph reviewed  and Notes from prior ED visits    Differential diagnosis includes, but is not limited to, bronchitis, community-acquired pneumonia, pneumothorax, less likely pulmonary embolism.  The patient is PERC negative and low risk for ACS based on heart score.  EKG is unremarkable.  He has breath sounds throughout so I think that a pneumothorax is unlikely although the breath sounds are somewhat coarse.  His oxygen saturation is around 98% in spite of his frequent protestations that he  cannot breathe.  I think anxiety/agitation is playing a significant component.  I put him on 2 L of oxygen for comfort as per his request and am giving another breathing treatment even though he says the first 1 did not help.  I have also ordered Ativan 1 mg IV which I think will help.  I have ordered a repeat chest x-ray to make sure there is no evidence of worsening bronchitis or community-acquired pneumonia as well as to rule out pneumothorax.  His heart rate has remained below 100 in spite of his anxiety and his agitation which is reassuring.  I have ordered repeat lab work but I think that the patient is having a viral respiratory infection which is compounded by his dental state and agitation over feeling bad.  I anticipate discharge and outpatient follow-up if no acute abnormalities are identified on his work-up.  Clinical Course as of Oct 26 338  Tue Oct 26, 2018  0228 Radiologist agrees with my assessment that there is no evidence of pneumothorax, and there does not appear to be any infiltrate.  Patient continues to have normal lab work including no leukocytosis.  DG Chest 2 View [CF]  0247 Flu negative  Influenza panel by PCR (type A & B) [CF]  0247 Normal  B Natriuretic Peptide: 9.0 [CF]  0336 The patient's work-up has been reassuring.  He has no evidence of any acute infection nor of any system failure such as evidence of heart failure.  He has no fluid on his lungs and no evidence of infiltrate.  Just in case I am giving him another prescription for azithromycin and encouraged him to fill this 1, and I am also giving him a prescription for albuterol inhaler to help with work of breathing, but I explained that most likely he has a viral illness (influenza-like) that is making him feel bad all over and adding to the generalized muscle aches and general malaise as well as shortness of breath.  He seems unsatisfied with this explanation but I have provided a very thorough work-up and can find  no other evidence of acute infectious or other emergent abnormality.  Even if he was not PERC negative, he has a Wells score for PE of 0 and there is no reason to think that he has a pulmonary embolism under the circumstances.  For a young otherwise healthy male with no risk factors it would not be appropriate to get a CTA chest, and given his extremely low pretest probability for PE, getting a d-dimer runs the risk of a false positive given his acute viral infection.  I encourage close outpatient follow-up and gave my usual customary return precautions.   [CF]    Clinical Course User Index [CF] York Cerise,  Kandee Keen, MD    ____________________________________________  FINAL CLINICAL IMPRESSION(S) / ED DIAGNOSES  Final diagnoses:  Bronchitis  SOB (shortness of breath)     MEDICATIONS GIVEN DURING THIS VISIT:  Medications  albuterol (PROVENTIL) (2.5 MG/3ML) 0.083% nebulizer solution 2.5 mg (2.5 mg Nebulization Given 10/26/18 0132)  dexamethasone (DECADRON) injection 10 mg (10 mg Intravenous Given 10/26/18 0138)  ipratropium-albuterol (DUONEB) 0.5-2.5 (3) MG/3ML nebulizer solution 3 mL (3 mLs Nebulization Given 10/26/18 0132)  LORazepam (ATIVAN) injection 1 mg (1 mg Intravenous Given 10/26/18 0139)     ED Discharge Orders         Ordered    azithromycin (ZITHROMAX) 250 MG tablet     10/26/18 0317    albuterol (PROVENTIL HFA;VENTOLIN HFA) 108 (90 Base) MCG/ACT inhaler     10/26/18 0317           Note:  This document was prepared using Dragon voice recognition software and may include unintentional dictation errors.    Loleta Rose, MD 10/26/18 [phone]

## 2018-10-26 NOTE — Discharge Instructions (Signed)
You have been seen in the Emergency Department (ED) today for a likely viral illness (bronchitis).  Please drink plenty of clear fluids (water, Gatorade, chicken broth, etc).  You may use Tylenol and/or Motrin according to label instructions.  You can alternate between the two without any side effects.   We gave you a new prescription for antibiotics which may help with your bronchitis, although it will not fix a viral infection.  We also wrote a prescription for an inhaler that may help with the shortness of breath and cough.  Please follow up with your doctor as listed above.  Call your doctor or return to the Emergency Department (ED) if you are unable to tolerate fluids due to vomiting, have worsening trouble breathing, become extremely tired or difficult to awaken, or if you develop any other symptoms that concern you.

## 2018-10-26 NOTE — ED Notes (Signed)
Placed pt on 2 L nasal cannula for comfort 

## 2018-10-27 ENCOUNTER — Emergency Department: Payer: Self-pay

## 2018-10-27 ENCOUNTER — Encounter: Payer: Self-pay | Admitting: Emergency Medicine

## 2018-10-27 ENCOUNTER — Inpatient Hospital Stay
Admission: EM | Admit: 2018-10-27 | Discharge: 2018-10-30 | DRG: 176 | Disposition: A | Discharge status: Home / Self Care | Payer: Self-pay | Attending: Internal Medicine | Admitting: Internal Medicine

## 2018-10-27 ENCOUNTER — Other Ambulatory Visit: Payer: Self-pay

## 2018-10-27 ENCOUNTER — Observation Stay: Payer: Self-pay

## 2018-10-27 DIAGNOSIS — F319 Bipolar disorder, unspecified: Secondary | ICD-10-CM | POA: Diagnosis present

## 2018-10-27 DIAGNOSIS — Z590 Homelessness: Secondary | ICD-10-CM

## 2018-10-27 DIAGNOSIS — F17210 Nicotine dependence, cigarettes, uncomplicated: Secondary | ICD-10-CM | POA: Diagnosis present

## 2018-10-27 DIAGNOSIS — Z832 Family history of diseases of the blood and blood-forming organs and certain disorders involving the immune mechanism: Secondary | ICD-10-CM

## 2018-10-27 DIAGNOSIS — R079 Chest pain, unspecified: Secondary | ICD-10-CM

## 2018-10-27 DIAGNOSIS — I2699 Other pulmonary embolism without acute cor pulmonale: Secondary | ICD-10-CM | POA: Diagnosis present

## 2018-10-27 DIAGNOSIS — F209 Schizophrenia, unspecified: Secondary | ICD-10-CM | POA: Diagnosis present

## 2018-10-27 DIAGNOSIS — I2694 Multiple subsegmental pulmonary emboli without acute cor pulmonale: Principal | ICD-10-CM | POA: Diagnosis present

## 2018-10-27 LAB — BASIC METABOLIC PANEL
ANION GAP: 10 (ref 5–15)
BUN: 14 mg/dL (ref 6–20)
CALCIUM: 9.6 mg/dL (ref 8.9–10.3)
CO2: 24 mmol/L (ref 22–32)
CREATININE: 0.85 mg/dL (ref 0.61–1.24)
Chloride: 102 mmol/L (ref 98–111)
GFR calc Af Amer: >60 mL/min (ref >60)
GFR calc non Af Amer: >60 mL/min (ref >60)
GLUCOSE: 91 mg/dL (ref 70–99)
Potassium: 3.8 mmol/L (ref 3.5–5.1)
Sodium: 136 mmol/L (ref 135–145)

## 2018-10-27 LAB — TROPONIN I

## 2018-10-27 LAB — CBC
HCT: 46.6 % (ref 39.0–52.0)
Hemoglobin: 15.3 g/dL (ref 13.0–17.0)
MCH: 30.1 pg (ref 26.0–34.0)
MCHC: 32.8 g/dL (ref 30.0–36.0)
MCV: 91.7 fL (ref 80.0–100.0)
PLATELETS: 224 10*3/uL (ref 150–400)
RBC: 5.08 MIL/uL (ref 4.22–5.81)
RDW: 11.4 % — ABNORMAL LOW (ref 11.5–15.5)
WBC: 11.4 10*3/uL — ABNORMAL HIGH (ref 4.0–10.5)
nRBC: 0 % (ref 0.0–0.2)

## 2018-10-27 LAB — PROTIME-INR
INR: 1.01
Prothrombin Time: 13.2 seconds (ref 11.4–15.2)

## 2018-10-27 LAB — APTT: aPTT: 29 seconds (ref 24–36)

## 2018-10-27 MED ORDER — FOLIC ACID 1 MG PO TABS
1.0000 mg | ORAL_TABLET | Freq: Every day | ORAL | Status: DC
  Administered 2018-10-27 – 2018-10-30 (×4): 1 mg via ORAL
  Filled 2018-10-27 (×4): qty 1

## 2018-10-27 MED ORDER — ADULT MULTIVITAMIN W/MINERALS CH
1.0000 | ORAL_TABLET | Freq: Every day | ORAL | Status: DC
  Administered 2018-10-27 – 2018-10-30 (×4): 1 via ORAL
  Filled 2018-10-27 (×4): qty 1

## 2018-10-27 MED ORDER — VITAMIN B-1 100 MG PO TABS
100.0000 mg | ORAL_TABLET | Freq: Every day | ORAL | Status: DC
  Administered 2018-10-27 – 2018-10-30 (×4): 100 mg via ORAL
  Filled 2018-10-27 (×4): qty 1

## 2018-10-27 MED ORDER — ONDANSETRON HCL 4 MG PO TABS: 4.0000 mg | ORAL_TABLET | Freq: Four times a day (QID) | ORAL | Status: DC | PRN

## 2018-10-27 MED ORDER — APIXABAN 5 MG PO TABS
10.0000 mg | ORAL_TABLET | Freq: Two times a day (BID) | ORAL | Status: DC
  Administered 2018-10-27 – 2018-10-30 (×6): 10 mg via ORAL
  Filled 2018-10-27 (×7): qty 2

## 2018-10-27 MED ORDER — HEPARIN BOLUS VIA INFUSION
5000.0000 [IU] | Freq: Once | INTRAVENOUS | Status: AC
  Administered 2018-10-27: 5000 [IU] via INTRAVENOUS
  Filled 2018-10-27: qty 5000

## 2018-10-27 MED ORDER — APIXABAN 5 MG PO TABS: 5.0000 mg | ORAL_TABLET | Freq: Two times a day (BID) | ORAL | Status: DC

## 2018-10-27 MED ORDER — ALBUTEROL SULFATE (2.5 MG/3ML) 0.083% IN NEBU: 2.5000 mg | INHALATION_SOLUTION | RESPIRATORY_TRACT | Status: DC | PRN

## 2018-10-27 MED ORDER — MORPHINE SULFATE (PF) 4 MG/ML IV SOLN
4.0000 mg | Freq: Once | INTRAVENOUS | Status: AC
  Administered 2018-10-27: 4 mg via INTRAVENOUS
  Filled 2018-10-27: qty 1

## 2018-10-27 MED ORDER — IOPAMIDOL (ISOVUE-370) INJECTION 76%
75.0000 mL | Freq: Once | INTRAVENOUS | Status: AC | PRN
  Administered 2018-10-27: 75 mL via INTRAVENOUS
  Filled 2018-10-27: qty 75

## 2018-10-27 MED ORDER — ACETAMINOPHEN 650 MG RE SUPP: 650.0000 mg | Freq: Four times a day (QID) | RECTAL | Status: DC | PRN

## 2018-10-27 MED ORDER — POLYETHYLENE GLYCOL 3350 17 G PO PACK
17.0000 g | PACK | Freq: Every day | ORAL | Status: DC | PRN
  Administered 2018-10-28: 18:00:00 17 g via ORAL
  Filled 2018-10-27: qty 1

## 2018-10-27 MED ORDER — HEPARIN (PORCINE) IN NACL 100-0.45 UNIT/ML-% IJ SOLN
1500.0000 [IU]/h | INTRAMUSCULAR | Status: DC
  Administered 2018-10-27: 1500 [IU]/h via INTRAVENOUS
  Filled 2018-10-27 (×2): qty 250

## 2018-10-27 MED ORDER — LORAZEPAM 2 MG/ML IJ SOLN: 1.0000 mg | Freq: Four times a day (QID) | INTRAMUSCULAR | Status: DC | PRN

## 2018-10-27 MED ORDER — MORPHINE SULFATE (PF) 2 MG/ML IV SOLN
2.0000 mg | INTRAVENOUS | Status: DC | PRN
  Administered 2018-10-27: 2 mg via INTRAVENOUS
  Filled 2018-10-27: qty 1

## 2018-10-27 MED ORDER — GUAIFENESIN-CODEINE 100-10 MG/5ML PO SOLN
5.0000 mL | Freq: Four times a day (QID) | ORAL | Status: DC | PRN
  Administered 2018-10-28 – 2018-10-30 (×2): 5 mL via ORAL
  Filled 2018-10-27 (×2): qty 5

## 2018-10-27 MED ORDER — OXYCODONE HCL 5 MG PO TABS
ORAL_TABLET | ORAL | Status: AC
  Administered 2018-10-27: 5 mg via ORAL
  Filled 2018-10-27: qty 1

## 2018-10-27 MED ORDER — LORAZEPAM 1 MG PO TABS: 1.0000 mg | ORAL_TABLET | Freq: Four times a day (QID) | ORAL | Status: DC | PRN

## 2018-10-27 MED ORDER — ONDANSETRON HCL 4 MG/2ML IJ SOLN: 4.0000 mg | Freq: Four times a day (QID) | INTRAMUSCULAR | Status: DC | PRN

## 2018-10-27 MED ORDER — IPRATROPIUM-ALBUTEROL 0.5-2.5 (3) MG/3ML IN SOLN
3.0000 mL | Freq: Once | RESPIRATORY_TRACT | Status: AC
  Administered 2018-10-27: 3 mL via RESPIRATORY_TRACT
  Filled 2018-10-27: qty 3

## 2018-10-27 MED ORDER — OXYCODONE HCL 5 MG PO TABS
5.0000 mg | ORAL_TABLET | ORAL | Status: DC | PRN
  Administered 2018-10-27 – 2018-10-30 (×4): 5 mg via ORAL
  Filled 2018-10-27 (×4): qty 1

## 2018-10-27 MED ORDER — THIAMINE HCL 100 MG/ML IJ SOLN
100.0000 mg | Freq: Every day | INTRAMUSCULAR | Status: DC
  Filled 2018-10-27 (×4): qty 1

## 2018-10-27 MED ORDER — ACETAMINOPHEN 325 MG PO TABS: 650.0000 mg | ORAL_TABLET | Freq: Four times a day (QID) | ORAL | Status: DC | PRN

## 2018-10-27 NOTE — ED Provider Notes (Signed)
[facility] Emergency Department Provider Note  Time seen: 5:39 PM  I have reviewed the triage vital signs and the nursing notes.   HISTORY  Chief Complaint Shortness of Breath    HPI Patrick Sparks is a 32 y.o. male with a past medical history of bipolar, schizophrenia, substance use, presents to the emergency department for right-sided chest pain.  According to the patient for the past 2 weeks he has had a frequent ongoing dry cough.  States significant right chest pain with any cough or deep inspiration.  Patient has been seen twice previously in the emergency department over the past 3 days for the same, 1 of which was by myself.  Prescribe Zithromax and cough medication.  Patient states he finally felt them today.  Denies any fever.  Denies abdominal pain.  Largely negative review of systems otherwise.   Past Medical History:  Diagnosis Date  . Bipolar 1 disorder (HCC)   . Schizophrenia [facility])     Patient Active Problem List   Diagnosis Date Noted  . Substance induced mood disorder (HCC) 04/15/2018  . Bipolar 1 disorder, depressed (HCC) 04/14/2018  . Alcohol abuse 04/14/2018  . Cocaine abuse (HCC) 04/14/2018    History reviewed. No pertinent surgical history.  Prior to Admission medications   Medication Sig Start Date End Date Taking? Authorizing Provider  albuterol (PROVENTIL HFA;VENTOLIN HFA) 108 (90 Base) MCG/ACT inhaler Inhale 2-4 puffs by mouth every 4 hours as needed for wheezing, cough, and/or shortness of breath 10/26/18   Loleta Rose, MD  azithromycin (ZITHROMAX) 250 MG tablet Take 2 tablets PO on day 1, then take 1 tablet PO daily for 4 more days 10/26/18   Loleta Rose, MD  guaiFENesin-codeine 100-10 MG/5ML syrup Take 5 mLs by mouth every 6 (six) hours as needed for cough. Patient not taking: Reported on 10/26/2018 10/24/18   Minna Antis, MD  OLANZapine (ZYPREXA) 10 MG tablet Take 1 tablet (10 mg total) by mouth at  bedtime. Patient not taking: Reported on 10/26/2018 04/15/18   Haskell Riling, MD    No Known Allergies  No family history on file.  Social History Social History   Tobacco Use  . Smoking status: Current Every Day Smoker    Types: Cigarettes  . Smokeless tobacco: Never Used  Substance Use Topics  . Alcohol use: Yes  . Drug use: Yes    Types: Cocaine    Comment: Last used 2 days ago    Review of Systems Constitutional: Negative for fever. ENT: Negative for recent illness/congestion Cardiovascular: Right-sided chest pain. Respiratory: Patient states shortness of breath and tightness in his chest.  Frequent cough per patient although no cough during examination. Gastrointestinal: Negative for abdominal pain Genitourinary: Negative for urinary compaints Musculoskeletal: Negative for musculoskeletal complaints Skin: Negative for skin complaints  Neurological: Negative for headache All other ROS negative  ____________________________________________   PHYSICAL EXAM:  VITAL SIGNS: ED Triage Vitals [10/27/18 1620]  Enc Vitals Group     BP 135/90     Pulse Rate 79     Resp 20     Temp 98.5 F (36.9 C)     Temp Source Oral     SpO2 96 %     Weight 229 lb 4.5 oz (104 kg)     Height 5\' 8"  (1.727 m)     Head Circumference      Peak Flow      Pain Score 10     Pain Loc  Pain Edu?      Excl. in GC?    Constitutional: Alert and oriented. Well appearing and in no distress. Eyes: Normal exam ENT   Head: Normocephalic and atraumatic.   Mouth/Throat: Mucous membranes are moist. Cardiovascular: Normal rate, regular rhythm. No murmur Respiratory: Normal respiratory effort without tachypnea nor retractions. Breath sounds are clear  Gastrointestinal: Soft and nontender. No distention.   Musculoskeletal: Nontender with normal range of motion in all extremities. No lower extremity tenderness or edema. Neurologic:  Normal speech and language. No gross focal neurologic  deficits Skin:  Skin is warm, dry and intact.  Psychiatric: Mood and affect are normal.  ____________________________________________    EKG  EKG reviewed and interpreted by myself shows a normal sinus rhythm 80 bpm with a narrow QRS, normal axis, largely normal intervals with no concerning ST changes.  ____________________________________________    RADIOLOGY  Chest x-ray negative  ____________________________________________   INITIAL IMPRESSION / ASSESSMENT AND PLAN / ED COURSE  Pertinent labs & imaging results that were available during my care of the patient were reviewed by me and considered in my medical decision making (see chart for details).  Patient presents to the emergency department with continued right-sided chest pain worse with deep inspiration or cough.  Patient states frequent cough, story concerning for bronchitis.  I reiterated to the patient the need to take his antibiotics as previously prescribed and cough medication as needed.  Given this is his third ER visit for the same pain despite negative work-ups each time I believe his CT angiography is warranted to rule out PE or other occult pneumonia given the patient's continued pain.  If CT angiography is negative the patient's work-up is otherwise been nonrevealing over the past 3 visits and I believe the patient would be safe for continued outpatient follow-up with the patient taking his antibiotics as prescribed by myself 3 days ago.  CT scan consistent with bilateral pulmonary emboli.  We will start the patient on heparin and admit to the hospitalist service for further treatment.  Patient agreeable to plan of care.  CRITICAL CARE Performed by: Minna Antis   Total critical care time: 30 minutes  Critical care time was exclusive of separately billable procedures and treating other patients.  Critical care was necessary to treat or prevent imminent or life-threatening deterioration.  Critical care  was time spent personally by me on the following activities: development of treatment plan with patient and/or surrogate as well as nursing, discussions with consultants, evaluation of patient's response to treatment, examination of patient, obtaining history from patient or surrogate, ordering and performing treatments and interventions, ordering and review of laboratory studies, ordering and review of radiographic studies, pulse oximetry and re-evaluation of patient's condition.   ____________________________________________   FINAL CLINICAL IMPRESSION(S) / ED DIAGNOSES  bilateral pulmonary emboli    Minna Antis, MD 10/27/18 [phone]

## 2018-10-27 NOTE — ED Notes (Signed)
MD at bedside with patient

## 2018-10-27 NOTE — ED Triage Notes (Addendum)
Patient from home via [facility] complaining of SOB worsening over the last week. Reports cough. Seen yesterday for same symptoms. Placed on 2L by EMS and reports improvement of symptoms with oxygen. Patient also complaining of tightness in chest.

## 2018-10-27 NOTE — ED Notes (Signed)
Patient transported to Ultrasound at this time. 

## 2018-10-27 NOTE — Progress Notes (Signed)
ANTICOAGULATION CONSULT NOTE - Initial Consult  Pharmacy Consult for Eliquis Indication: pulmonary embolus  No Known Allergies  Patient Measurements: Height: 5\' 8"  (172.7 cm) Weight: 229 lb 4.5 oz (104 kg) IBW/kg (Calculated) : 68.4 Heparin Dosing Weight: 91 kg  Vital Signs: Temp: 98.5 F (36.9 C) (10/30 1620) Temp Source: Oral (10/30 1620) BP: 118/72 (10/30 1904) Pulse Rate: 83 (10/30 1904)  Labs: Recent Labs    10/26/18 0136 10/27/18 1622 10/27/18 1902  HGB 15.2 15.3  --   HCT 46.0 46.6  --   PLT 212 224  --   APTT  --   --  29  LABPROT  --   --  13.2  INR  --   --  1.01  CREATININE 0.83 0.85  --   TROPONINI <0.03 <0.03  --     Estimated Creatinine Clearance: 144.4 mL/min (by C-G formula based on SCr of 0.85 mg/dL).   Medical History: Past Medical History:  Diagnosis Date  . Bipolar 1 disorder (HCC)   . Schizophrenia (HCC)     Medications:  Infusions:    Assessment: 32 yom cc SOB/right-sided chest pain with PMH bipolar 1 disorder and schizophrenia. Patient was recently in ED and discharged with antibiotics for CAP. CT now showling b/l PE. Pharmacy consulted to manage UFH. No PTA OAC noted.   At 20:00 Dr. Elpidio Anis discontinued the pharmacy consult for heparin and replaced it with PTD apixaban.   Goal of Therapy:  Heparin level 0.3-0.7 units/ml Monitor platelets by anticoagulation protocol: Yes   Plan:  Apixaban 10 mg po BID x 7 days followed by apixaban 5 mg po BID.  Carola Frost, Pharm.D., BCPS Clinical Pharmacist 10/27/2018,8:03 PM

## 2018-10-27 NOTE — ED Notes (Signed)
First NURSE NOTE: pt comes into the ED via EMS from home with c/o cough with SOB . Pt was seen here twice this week for the same sx. Pt is 95% on RA, pt arrives on 2L [place] for pt comfort. Pt is in NAD upon arrival.

## 2018-10-27 NOTE — ED Notes (Signed)
Pt reporting his chest is tightening up at this time and he is feeling SOB. PT requesting to be placed on oxygen. Pts oxygen saturation is 92% on RA. Pt placed on 2L.

## 2018-10-27 NOTE — H&P (Signed)
[facility] - [place] [facility]   PATIENT NAME: Patrick Sparks    MR#:  [id number]  DATE OF BIRTH:  12-21-1986  DATE OF ADMISSION:  10/27/2018  PRIMARY CARE PHYSICIAN: Patient, No Pcp Per   REQUESTING/REFERRING PHYSICIAN: Dr. Lenard Sparks  CHIEF COMPLAINT:   Chief Complaint  Patient presents with  . Shortness of Breath    HISTORY OF PRESENT ILLNESS:  Patrick Sparks  is a 32 y.o. male with a known history of schizophrenia, bipolar disorder, alcohol abuse, cocaine abuse, tobacco use presents to the emergency room for his third visit due to bilateral chest pain and shortness of breath with cough.  Patient was seen yesterday and 3 days back and discharged home on azithromycin and albuterol for possible acute bronchitis.  Today a CTA of the chest was checked and shows bilateral segmental pulmonary emboli without RV strain.  Patient saturations are 96% on room air.  Not tachycardic.  Patient is needing multiple IV pain medications for his PE and is being admitted overnight for observation.  No prior history of pulmonary emboli.  He has noticed bilateral ankle swelling for the past 3 days.  No recent surgery or road trips or long flight journeys.  No hypercoagulable state.  PAST MEDICAL HISTORY:   Past Medical History:  Diagnosis Date  . Bipolar 1 disorder (HCC)   . Schizophrenia (HCC)     PAST SURGICAL HISTORY:  History reviewed. No pertinent surgical history.  SOCIAL HISTORY:   Social History   Tobacco Use  . Smoking status: Current Every Day Smoker    Types: Cigarettes  . Smokeless tobacco: Never Used  Substance Use Topics  . Alcohol use: Yes    FAMILY HISTORY:  No family history on file. Mother with sickle cell disease  DRUG ALLERGIES:  No Known Allergies  REVIEW OF SYSTEMS:   Review of Systems  Constitutional: Positive for malaise/fatigue. Negative for chills, fever and weight loss.  HENT: Negative for hearing loss and nosebleeds.    Eyes: Negative for blurred vision, double vision and pain.  Respiratory: Positive for cough and shortness of breath. Negative for hemoptysis, sputum production and wheezing.   Cardiovascular: Positive for chest pain and leg swelling. Negative for palpitations and orthopnea.  Gastrointestinal: Negative for abdominal pain, constipation, diarrhea, nausea and vomiting.  Genitourinary: Negative for dysuria and hematuria.  Musculoskeletal: Negative for back pain, falls and myalgias.  Skin: Negative for rash.  Neurological: Negative for dizziness, tremors, sensory change, speech change, focal weakness, seizures and headaches.  Endo/Heme/Allergies: Does not bruise/bleed easily.  Psychiatric/Behavioral: Negative for depression and memory loss. The patient is not nervous/anxious.     MEDICATIONS AT HOME:   Prior to Admission medications   Medication Sig Start Date End Date Taking? Authorizing Provider  albuterol (PROVENTIL HFA;VENTOLIN HFA) 108 (90 Base) MCG/ACT inhaler Inhale 2-4 puffs by mouth every 4 hours as needed for wheezing, cough, and/or shortness of breath 10/26/18  Yes Patrick Rose, MD  azithromycin (ZITHROMAX) 250 MG tablet Take 2 tablets PO on day 1, then take 1 tablet PO daily for 4 more days 10/26/18  Yes Patrick Rose, MD  guaiFENesin-codeine 100-10 MG/5ML syrup Take 5 mLs by mouth every 6 (six) hours as needed for cough. Patient not taking: Reported on 10/26/2018 10/24/18   Patrick Antis, MD  OLANZapine (ZYPREXA) 10 MG tablet Take 1 tablet (10 mg total) by mouth at bedtime. Patient not taking: Reported on 10/26/2018 04/15/18   Patrick Riling, MD     VITAL  SIGNS:  Blood pressure 118/72, pulse 83, temperature 98.5 F (36.9 C), temperature source Oral, resp. rate (!) 22, height 5\' 8"  (1.727 m), weight 104 kg, SpO2 96 %.  PHYSICAL EXAMINATION:  Physical Exam  GENERAL:  32 y.o.-year-old patient lying in the bed with conversational dyspnea EYES: Pupils equal, round, reactive  to light and accommodation. No scleral icterus. Extraocular muscles intact.  HEENT: Head atraumatic, normocephalic. Oropharynx and nasopharynx clear. No oropharyngeal erythema, moist oral mucosa  NECK:  Supple, no jugular venous distention. No thyroid enlargement, no tenderness.  LUNGS: Decreased air entry bilaterally.  Shallow breathing.  Clear to auscultation CARDIOVASCULAR: S1, S2 normal. No murmurs, rubs, or gallops.  ABDOMEN: Soft, nontender, nondistended. Bowel sounds present. No organomegaly or mass.  EXTREMITIES: No pedal edema, cyanosis, or clubbing. + 2 pedal & radial pulses b/l.   NEUROLOGIC: Cranial nerves II through XII are intact. No focal Motor or sensory deficits appreciated b/l PSYCHIATRIC: The patient is alert and oriented x 3. Good affect.  SKIN: No obvious rash, lesion, or ulcer.   LABORATORY PANEL:   CBC Recent Labs  Lab 10/27/18 1622  WBC 11.4*  HGB 15.3  HCT 46.6  PLT 224   ------------------------------------------------------------------------------------------------------------------  Chemistries  Recent Labs  Lab 10/26/18 0136 10/27/18 1622  NA 140 136  K 3.6 3.8  CL 104 102  CO2 25 24  GLUCOSE 111* 91  BUN 8 14  CREATININE 0.83 0.85  CALCIUM 8.8* 9.6  AST 22  --   ALT 24  --   ALKPHOS 43  --   BILITOT 1.1  --    ------------------------------------------------------------------------------------------------------------------  Cardiac Enzymes Recent Labs  Lab 10/27/18 1622  TROPONINI <0.03   ------------------------------------------------------------------------------------------------------------------  RADIOLOGY:  Dg Chest 2 View  Result Date: 10/27/2018 CLINICAL DATA:  Shortness of breath, chest pain EXAM: CHEST - 2 VIEW COMPARISON:  10/26/2018 FINDINGS: Bibasilar opacities likely atelectasis. Heart is normal size. No effusions or acute bony abnormality. IMPRESSION: Bibasilar atelectasis. Electronically Signed   By: Patrick Sparks  M.D.   On: 10/27/2018 16:47   Dg Chest 2 View  Result Date: 10/26/2018 CLINICAL DATA:  32 year old male with shortness of breath EXAM: CHEST - 2 VIEW COMPARISON:  Chest radiograph dated 10/24/2018 FINDINGS: Shallow inspiration with bibasilar atelectasis. Infiltrate is less likely. Clinical correlation is recommended. There is no pleural effusion or pneumothorax. The cardiac silhouette is within normal limits. No acute osseous pathology. IMPRESSION: Shallow inspiration with bibasilar atelectasis. Infiltrate is less likely. Electronically Signed   By: Elgie Collard M.D.   On: 10/26/2018 02:22   Ct Angio Chest Pe W And/or Wo Contrast  Result Date: 10/27/2018 CLINICAL DATA:  Cough and shortness of breath with chest wall pain EXAM: CT ANGIOGRAPHY CHEST WITH CONTRAST TECHNIQUE: Multidetector CT imaging of the chest was performed using the standard protocol during bolus administration of intravenous contrast. Multiplanar CT image reconstructions and MIPs were obtained to evaluate the vascular anatomy. CONTRAST:  75mL ISOVUE-370 IOPAMIDOL (ISOVUE-370) INJECTION 76% COMPARISON:  None. FINDINGS: Cardiovascular: --Pulmonary arteries: Contrast injection is sufficient to demonstrate satisfactory opacification of the pulmonary arteries to the segmental level.There are bilateral pulmonary emboli. On the right, there are emboli within segmental branches of the upper and middle lobes. On the left, the emboli are in the proximal segmental branches of the lower lobe. There is no evidence of right heart strain. The main pulmonary artery is within normal limits for size. --Aorta: Limited opacification of the aorta due to bolus timing optimization for the pulmonary arteries.  Conventional 3 vessel aortic branching pattern. The aortic course and caliber are normal. There is no aortic atherosclerosis. --Heart: Mild cardiomegaly. No pericardial effusion. Mediastinum/Nodes: No mediastinal, hilar or axillary lymphadenopathy. The  visualized thyroid and thoracic esophageal course are unremarkable. Lungs/Pleura: Bibasilar consolidation or atelectasis. No pleural effusion or pneumothorax. Upper Abdomen: Contrast bolus timing is not optimized for evaluation of the abdominal organs. Within this limitation, the visualized organs of the upper abdomen are normal. Musculoskeletal: No chest wall abnormality. No acute or significant osseous findings. Review of the MIP images confirms the above findings. IMPRESSION: 1. Bilateral segmental pulmonary emboli, including the segmental branches of the right upper and middle lobes and the left lobe. 2. No CT evidence of right heart strain. 3. Bibasilar opacities which could indicate atelectasis or pneumonia. Critical Value/emergent results were called by telephone at the time of interpretation on 10/27/2018 at 6:41 pm to Dr. Minna Sparks , who verbally acknowledged these results. Electronically Signed   By: Deatra Robinson M.D.   On: 10/27/2018 18:42     IMPRESSION AND PLAN:   *Acute bilateral pulmonary emboli, segmental No signs of RV strain on CT scan.  Patient is not hypoxic.  Has significant pain needing IV pain medications.  Admit under observation.  Will start Eliquis.  Order hypercoagulable panel as this seems to be unprovoked pulmonary embolism.  Will check echocardiogram and lower committee Dopplers.  *Alcohol abuse.  High risk for withdrawals.  Start CIWA protocol.  *Tobacco abuse.  Counseled to quit greater than 3 minutes.  All the records are reviewed and case discussed with ED provider. Management plans discussed with the patient, family and they are in agreement.  CODE STATUS: Full code  TOTAL TIME TAKING CARE OF THIS PATIENT: 40 minutes.   Molinda Bailiff Lenna Hagarty M.D on 10/27/2018 at 8:04 PM  Between 7am to 6pm - Pager - [phone]  After 6pm go to [email or web address] - password [email or web address]  [facility]  Office  [phone]  CC: Primary care physician;  Patient, No Pcp Per  Note: This dictation was prepared with Dragon dictation along with smaller phrase technology. Any transcriptional errors that result from this process are unintentional.

## 2018-10-27 NOTE — Progress Notes (Signed)
ANTICOAGULATION CONSULT NOTE - Initial Consult  Pharmacy Consult for heparin Indication: pulmonary embolus  No Known Allergies  Patient Measurements: Height: 5\' 8"  (172.7 cm) Weight: 229 lb 4.5 oz (104 kg) IBW/kg (Calculated) : 68.4 Heparin Dosing Weight: 91 kg  Vital Signs: Temp: 98.5 F (36.9 C) (10/30 1620) Temp Source: Oral (10/30 1620) BP: 135/90 (10/30 1620) Pulse Rate: 79 (10/30 1620)  Labs: Recent Labs    10/26/18 0136 10/27/18 1622  HGB 15.2 15.3  HCT 46.0 46.6  PLT 212 224  CREATININE 0.83 0.85  TROPONINI <0.03 <0.03    Estimated Creatinine Clearance: 144.4 mL/min (by C-G formula based on SCr of 0.85 mg/dL).   Medical History: Past Medical History:  Diagnosis Date  . Bipolar 1 disorder (HCC)   . Schizophrenia (HCC)     Medications:  Infusions:  . heparin      Assessment: 32 yom cc SOB/right-sided chest pain with PMH bipolar 1 disorder and schizophrenia. Patient was recently in ED and discharged with antibiotics for CAP. CT now showling b/l PE. Pharmacy consulted to manage UFH. No PTA OAC noted.   Goal of Therapy:  Heparin level 0.3-0.7 units/ml Monitor platelets by anticoagulation protocol: Yes   Plan:  Give 5000 units bolus x 1 Start heparin infusion at 1500 units/hr Check anti-Xa level in 6 hours and daily while on heparin Continue to monitor H&H and platelets  Carola Frost, Pharm.D., BCPS Clinical Pharmacist 10/27/2018,6:53 PM

## 2018-10-27 NOTE — ED Notes (Signed)
Patient transported to Ultrasound 

## 2018-10-28 ENCOUNTER — Observation Stay (HOSPITAL_COMMUNITY)
Admit: 2018-10-28 | Discharge: 2018-10-28 | Disposition: A | Discharge status: Home / Self Care | Payer: Self-pay | Attending: Internal Medicine | Admitting: Internal Medicine

## 2018-10-28 DIAGNOSIS — I340 Nonrheumatic mitral (valve) insufficiency: Secondary | ICD-10-CM

## 2018-10-28 LAB — BASIC METABOLIC PANEL
Anion gap: 8 (ref 5–15)
BUN: 14 mg/dL (ref 6–20)
CHLORIDE: 99 mmol/L (ref 98–111)
CO2: 26 mmol/L (ref 22–32)
CREATININE: 0.79 mg/dL (ref 0.61–1.24)
Calcium: 9.3 mg/dL (ref 8.9–10.3)
GFR calc Af Amer: >60 mL/min (ref >60)
GFR calc non Af Amer: >60 mL/min (ref >60)
GLUCOSE: 104 mg/dL — AB (ref 70–99)
Potassium: 4.1 mmol/L (ref 3.5–5.1)
Sodium: 133 mmol/L — ABNORMAL LOW (ref 135–145)

## 2018-10-28 LAB — ECHOCARDIOGRAM COMPLETE
Height: 68 in
Weight: 3671.98 oz

## 2018-10-28 LAB — CBC
HEMATOCRIT: 43.2 % (ref 39.0–52.0)
HEMOGLOBIN: 14.4 g/dL (ref 13.0–17.0)
MCH: 30.1 pg (ref 26.0–34.0)
MCHC: 33.3 g/dL (ref 30.0–36.0)
MCV: 90.2 fL (ref 80.0–100.0)
Platelets: 240 10*3/uL (ref 150–400)
RBC: 4.79 MIL/uL (ref 4.22–5.81)
RDW: 11.2 % — AB (ref 11.5–15.5)
WBC: 10.1 10*3/uL (ref 4.0–10.5)
nRBC: 0 % (ref 0.0–0.2)

## 2018-10-28 LAB — ANTITHROMBIN III: ANTITHROMB III FUNC: 105 % (ref 75–120)

## 2018-10-28 MED ORDER — MORPHINE SULFATE (PF) 4 MG/ML IV SOLN
4.0000 mg | INTRAVENOUS | Status: DC | PRN
  Administered 2018-10-28 – 2018-10-29 (×4): 4 mg via INTRAVENOUS
  Filled 2018-10-28 (×4): qty 1

## 2018-10-28 MED ORDER — OXYCODONE HCL 10 MG PO TABS: 5.0000 mg | ORAL_TABLET | Freq: Four times a day (QID) | ORAL | 0 refills | Status: DC | PRN

## 2018-10-28 MED ORDER — KETOROLAC TROMETHAMINE 30 MG/ML IJ SOLN
30.0000 mg | Freq: Once | INTRAMUSCULAR | Status: AC
  Administered 2018-10-28: 13:00:00 30 mg via INTRAVENOUS
  Filled 2018-10-28: qty 1

## 2018-10-28 MED ORDER — ELIQUIS 5 MG VTE STARTER PACK: ORAL_TABLET | ORAL | 0 refills | Status: AC

## 2018-10-28 NOTE — Progress Notes (Signed)
[facility] [place] [facility]   PATIENT NAME: Patrick Sparks    MR#:  [id number]  DATE OF BIRTH:  02/07/1986  SUBJECTIVE:  CHIEF COMPLAINT:   Chief Complaint  Patient presents with  . Shortness of Breath   Continues to complain of significant chest pain.  Tells me he cannot go home with this pain. No cough.  Afebrile.  REVIEW OF SYSTEMS:    Review of Systems  Constitutional: Positive for malaise/fatigue. Negative for chills and fever.  HENT: Negative for sore throat.   Eyes: Negative for blurred vision, double vision and pain.  Respiratory: Negative for cough, hemoptysis, shortness of breath and wheezing.   Cardiovascular: Positive for chest pain. Negative for palpitations, orthopnea and leg swelling.  Gastrointestinal: Negative for abdominal pain, constipation, diarrhea, heartburn, nausea and vomiting.  Genitourinary: Negative for dysuria and hematuria.  Musculoskeletal: Negative for back pain and joint pain.  Skin: Negative for rash.  Neurological: Negative for sensory change, speech change, focal weakness and headaches.  Endo/Heme/Allergies: Does not bruise/bleed easily.  Psychiatric/Behavioral: Negative for depression. The patient is not nervous/anxious.     DRUG ALLERGIES:  No Known Allergies  VITALS:  Blood pressure 126/76, pulse 75, temperature 98.9 F (37.2 C), temperature source Oral, resp. rate 20, height 5\' 8"  (1.727 m), weight 104.1 kg, SpO2 92 %.  PHYSICAL EXAMINATION:   Physical Exam  GENERAL:  32 y.o.-year-old patient lying in the bed with no acute distress.  EYES: Pupils equal, round, reactive to light and accommodation. No scleral icterus. Extraocular muscles intact.  HEENT: Head atraumatic, normocephalic. Oropharynx and nasopharynx clear.  NECK:  Supple, no jugular venous distention. No thyroid enlargement, no tenderness.  LUNGS: Normal breath sounds bilaterally, no wheezing, rales, rhonchi. No use of accessory muscles of  respiration.  CARDIOVASCULAR: S1, S2 normal. No murmurs, rubs, or gallops.  ABDOMEN: Soft, nontender, nondistended. Bowel sounds present. No organomegaly or mass.  EXTREMITIES: No cyanosis, clubbing or edema b/l.    NEUROLOGIC: Cranial nerves II through XII are intact. No focal Motor or sensory deficits b/l.   PSYCHIATRIC: The patient is alert and oriented x 3.  SKIN: No obvious rash, lesion, or ulcer.   LABORATORY PANEL:   CBC Recent Labs  Lab 10/28/18 0603  WBC 10.1  HGB 14.4  HCT 43.2  PLT 240   ------------------------------------------------------------------------------------------------------------------ Chemistries  Recent Labs  Lab 10/26/18 0136  10/28/18 0603  NA 140   < > 133*  K 3.6   < > 4.1  CL 104   < > 99  CO2 25   < > 26  GLUCOSE 111*   < > 104*  BUN 8   < > 14  CREATININE 0.83   < > 0.79  CALCIUM 8.8*   < > 9.3  AST 22  --   --   ALT 24  --   --   ALKPHOS 43  --   --   BILITOT 1.1  --   --    < > = values in this interval not displayed.   ------------------------------------------------------------------------------------------------------------------  Cardiac Enzymes Recent Labs  Lab 10/27/18 1622  TROPONINI <0.03   ------------------------------------------------------------------------------------------------------------------  RADIOLOGY:  Dg Chest 2 View  Result Date: 10/27/2018 CLINICAL DATA:  Shortness of breath, chest pain EXAM: CHEST - 2 VIEW COMPARISON:  10/26/2018 FINDINGS: Bibasilar opacities likely atelectasis. Heart is normal size. No effusions or acute bony abnormality. IMPRESSION: Bibasilar atelectasis. Electronically Signed   By: Charlett Nose M.D.   On: 10/27/2018  16:47   Ct Angio Chest Pe W And/or Wo Contrast  Result Date: 10/27/2018 CLINICAL DATA:  Cough and shortness of breath with chest wall pain EXAM: CT ANGIOGRAPHY CHEST WITH CONTRAST TECHNIQUE: Multidetector CT imaging of the chest was performed using the standard  protocol during bolus administration of intravenous contrast. Multiplanar CT image reconstructions and MIPs were obtained to evaluate the vascular anatomy. CONTRAST:  75mL ISOVUE-370 IOPAMIDOL (ISOVUE-370) INJECTION 76% COMPARISON:  None. FINDINGS: Cardiovascular: --Pulmonary arteries: Contrast injection is sufficient to demonstrate satisfactory opacification of the pulmonary arteries to the segmental level.There are bilateral pulmonary emboli. On the right, there are emboli within segmental branches of the upper and middle lobes. On the left, the emboli are in the proximal segmental branches of the lower lobe. There is no evidence of right heart strain. The main pulmonary artery is within normal limits for size. --Aorta: Limited opacification of the aorta due to bolus timing optimization for the pulmonary arteries. Conventional 3 vessel aortic branching pattern. The aortic course and caliber are normal. There is no aortic atherosclerosis. --Heart: Mild cardiomegaly. No pericardial effusion. Mediastinum/Nodes: No mediastinal, hilar or axillary lymphadenopathy. The visualized thyroid and thoracic esophageal course are unremarkable. Lungs/Pleura: Bibasilar consolidation or atelectasis. No pleural effusion or pneumothorax. Upper Abdomen: Contrast bolus timing is not optimized for evaluation of the abdominal organs. Within this limitation, the visualized organs of the upper abdomen are normal. Musculoskeletal: No chest wall abnormality. No acute or significant osseous findings. Review of the MIP images confirms the above findings. IMPRESSION: 1. Bilateral segmental pulmonary emboli, including the segmental branches of the right upper and middle lobes and the left lobe. 2. No CT evidence of right heart strain. 3. Bibasilar opacities which could indicate atelectasis or pneumonia. Critical Value/emergent results were called by telephone at the time of interpretation on 10/27/2018 at 6:41 pm to Dr. Minna Antis , who  verbally acknowledged these results. Electronically Signed   By: Deatra Robinson M.D.   On: 10/27/2018 18:42   US Venous Img Lower Bilateral  Result Date: 10/27/2018 CLINICAL DATA:  Pulmonary embolus. EXAM: BILATERAL LOWER EXTREMITY VENOUS DOPPLER ULTRASOUND TECHNIQUE: Gray-scale sonography with graded compression, as well as color Doppler and duplex ultrasound were performed to evaluate the lower extremity deep venous systems from the level of the common femoral vein and including the common femoral, femoral, profunda femoral, popliteal and calf veins including the posterior tibial, peroneal and gastrocnemius veins when visible. The superficial great saphenous vein was also interrogated. Spectral Doppler was utilized to evaluate flow at rest and with distal augmentation maneuvers in the common femoral, femoral and popliteal veins. COMPARISON:  None. FINDINGS: RIGHT LOWER EXTREMITY Common Femoral Vein: No evidence of thrombus. Normal compressibility, respiratory phasicity and response to augmentation. Saphenofemoral Junction: No evidence of thrombus. Normal compressibility and flow on color Doppler imaging. Profunda Femoral Vein: No evidence of thrombus. Normal compressibility and flow on color Doppler imaging. Femoral Vein: No evidence of thrombus. Normal compressibility, respiratory phasicity and response to augmentation. Popliteal Vein: No evidence of thrombus. Normal compressibility, respiratory phasicity and response to augmentation. Calf Veins: No evidence of thrombus. Normal compressibility and flow on color Doppler imaging. Superficial Great Saphenous Vein: No evidence of thrombus. Normal compressibility. Venous Reflux:  None. Other Findings: Generalized slow flow. LEFT LOWER EXTREMITY Common Femoral Vein: No evidence of thrombus. Normal compressibility, respiratory phasicity and response to augmentation. Saphenofemoral Junction: No evidence of thrombus. Normal compressibility and flow on color Doppler  imaging. Profunda Femoral Vein: No evidence of thrombus. Normal  compressibility and flow on color Doppler imaging. Femoral Vein: No evidence of thrombus. Normal compressibility, respiratory phasicity and response to augmentation. Popliteal Vein: No evidence of thrombus. Normal compressibility, respiratory phasicity and response to augmentation. Calf Veins: No evidence of thrombus. Normal compressibility and flow on color Doppler imaging. Superficial Great Saphenous Vein: No evidence of thrombus. Normal compressibility. Venous Reflux:  None. Other Findings: Generalized slow flow. IMPRESSION: No evidence of deep venous thrombosis. Electronically Signed   By: Deatra Robinson M.D.   On: 10/27/2018 21:22     ASSESSMENT AND PLAN:    *Acute bilateral pulmonary emboli, segmental No signs of RV strain on CT scan.  Patient is not hypoxic.   Continues to have significant pain needing IV pain medications. Started on Eliquis  Order hypercoagulable panel as this seems to be unprovoked pulmonary embolism.   Lower extremity venous Dopplers with no DVT.  Echocardiogram with no RV strain  *Alcohol abuse.  High risk for withdrawals.  Start CIWA protocol.  Patient is homeless.  Also has cocaine and alcohol abuse.  Unclear if patient is drug-seeking.  We will see how he does overnight and discharge in the morning.  All the records are reviewed and case discussed with Care Management/Social Workerr. Management plans discussed with the patient, family and they are in agreement.  CODE STATUS: Full code  TOTAL TIME TAKING CARE OF THIS PATIENT: 35 minutes.   POSSIBLE D/C IN 1-2 DAYS, DEPENDING ON CLINICAL CONDITION.  Molinda Bailiff Mackay Hanauer M.D on 10/28/2018 at 12:55 PM  Between 7am to 6pm - Pager - [phone]  After 6pm go to [email or web address] - password [email or web address]  [facility] [place] [facility]  Office  [phone]  CC: Primary care physician; Patient, No Pcp Per  Note: This dictation was prepared with Dragon  dictation along with smaller phrase technology. Any transcriptional errors that result from this process are unintentional.

## 2018-10-28 NOTE — Plan of Care (Signed)
?  Problem: Health Behavior/Discharge Planning: ?Goal: Ability to manage health-related needs will improve ?Outcome: Progressing ?  ?Problem: Clinical Measurements: ?Goal: Ability to maintain clinical measurements within normal limits will improve ?Outcome: Progressing ?Goal: Will remain free from infection ?Outcome: Progressing ?  ?Problem: Activity: ?Goal: Risk for activity intolerance will decrease ?Outcome: Progressing ?  ?Problem: Nutrition: ?Goal: Adequate nutrition will be maintained ?Outcome: Progressing ?  ?Problem: Safety: ?Goal: Ability to remain free from injury will improve ?Outcome: Progressing ?  ?Problem: Skin Integrity: ?Goal: Risk for impaired skin integrity will decrease ?Outcome: Progressing ?  ?

## 2018-10-28 NOTE — Progress Notes (Signed)
*  PRELIMINARY RESULTS* Echocardiogram 2D Echocardiogram has been performed.  Patrick Sparks Patrick Sparks 10/28/2018, 10:55 AM

## 2018-10-28 NOTE — Care Management Note (Addendum)
Case Management Note  Patient Details  Name: Erland Vivas MRN: [id number] Date of Birth: 15-May-1986  Subjective/Objective:    Admitted to [facility] with the diagnosis of pulmonary embolism. States he hasn't had an established home to live in x 4 months. States he has lived with different friends. No primary care physician. No pharmacy. No picture ID. Never has been to Shelter due to not having picture ID.  Mother is Devona Konig [phone]). States he has no cell phone.  States he hasn't worked since 2000. He worked with concrete.                Action/Plan: Discussed [facility] and Medication Management.  Discussed he would need and ID to go to these clinics. Talk to Johnell Comings, representative for [facility]. Could possible use petty cash for ID at  [facility] department. Also, bus to [facility] facility. Update to Charlyne Petrin Clinical Social Worker   Expected Discharge Date:  10/28/18               Expected Discharge Plan:     In-House Referral:   yes  Discharge planning Services   yes  Post Acute Care Choice:    Choice offered to:     DME Arranged:    DME Agency:     HH Arranged:    HH Agency:     Status of Service:     If discussed at [employer], dates discussed:    Additional Comments:  Gwenette Greet, RN MSN CCM Care Management [phone] 10/28/2018, 2:54 PM

## 2018-10-29 ENCOUNTER — Inpatient Hospital Stay: Payer: Self-pay

## 2018-10-29 LAB — URINE DRUG SCREEN, QUALITATIVE (ARMC ONLY)
Amphetamines, Ur Screen: NOT DETECTED
Barbiturates, Ur Screen: NOT DETECTED
Benzodiazepine, Ur Scrn: NOT DETECTED
COCAINE METABOLITE, UR ~~LOC~~: POSITIVE — AB
Cannabinoid 50 Ng, Ur ~~LOC~~: NOT DETECTED
MDMA (ECSTASY) UR SCREEN: NOT DETECTED
Methadone Scn, Ur: NOT DETECTED
Opiate, Ur Screen: POSITIVE — AB
PHENCYCLIDINE (PCP) UR S: NOT DETECTED
Tricyclic, Ur Screen: NOT DETECTED

## 2018-10-29 LAB — CARDIOLIPIN ANTIBODIES, IGG, IGM, IGA
Anticardiolipin IgG: <9 GPL U/mL (ref 0–14)
Anticardiolipin IgM: 12 MPL U/mL (ref 0–12)

## 2018-10-29 LAB — URINALYSIS, COMPLETE (UACMP) WITH MICROSCOPIC
Bacteria, UA: NONE SEEN
Bilirubin Urine: NEGATIVE
GLUCOSE, UA: 50 mg/dL — AB
Hgb urine dipstick: NEGATIVE
Ketones, ur: NEGATIVE mg/dL
Leukocytes, UA: NEGATIVE
Nitrite: NEGATIVE
PROTEIN: 100 mg/dL — AB
SPECIFIC GRAVITY, URINE: 1.019 (ref 1.005–1.030)
pH: 6 (ref 5.0–8.0)

## 2018-10-29 LAB — HOMOCYSTEINE: Homocysteine: 15.7 umol/L — ABNORMAL HIGH (ref 0.0–15.0)

## 2018-10-29 LAB — HIV ANTIBODY (ROUTINE TESTING W REFLEX): HIV Screen 4th Generation wRfx: NONREACTIVE

## 2018-10-29 MED ORDER — CEFDINIR 300 MG PO CAPS
300.0000 mg | ORAL_CAPSULE | Freq: Two times a day (BID) | ORAL | Status: DC
  Administered 2018-10-29 – 2018-10-30 (×3): 300 mg via ORAL
  Filled 2018-10-29 (×4): qty 1

## 2018-10-29 MED ORDER — IBUPROFEN 400 MG PO TABS
400.0000 mg | ORAL_TABLET | Freq: Three times a day (TID) | ORAL | Status: DC
  Administered 2018-10-29: 400 mg via ORAL
  Filled 2018-10-29 (×2): qty 1

## 2018-10-29 MED ORDER — MORPHINE SULFATE (PF) 4 MG/ML IV SOLN
4.0000 mg | Freq: Four times a day (QID) | INTRAVENOUS | Status: DC | PRN
  Administered 2018-10-29 – 2018-10-30 (×2): 4 mg via INTRAVENOUS
  Filled 2018-10-29 (×2): qty 1

## 2018-10-29 NOTE — Clinical Social Work Note (Signed)
Clinical Social Work Assessment  Patient Details  Name: Patrick Sparks MRN: [id number] Date of Birth: 03-Jan-1986  Date of referral:  10/29/18               Reason for consult:  Housing Concerns/Homelessness                Permission sought to share information with:  Case Freight forwarder, Customer service manager, Family Supports Permission granted to share information::  Yes, Verbal Permission Granted  Name::        Agency::     Relationship::     Contact Information:     Housing/Transportation Living arrangements for the past 2 months:  Homeless Source of Information:  Patient Patient Interpreter Needed:  None Criminal Activity/Legal Involvement Pertinent to Current Situation/Hospitalization:  No - Comment as needed Significant Relationships:  None Lives with:  Self Do you feel safe going back to the place where you live?  Yes Need for family participation in patient care:  No (Coment)  Care giving concerns:  Patient is homeless   Facilities manager / plan:  CSW consulted for homelessness. CSW met with patient to discuss discharge plan. CSW introduced self and explained role. Patient states that he has been homeless for about 4 months and has no place to stay. CSW gave patient resources on shelters in the area. Patient states he does not have an ID or SSN card. CSW explained that patient would not be able to go to a shelter without a form of ID and he would not be able to get an ID without any thing to prove identity. Patient states that he has a mother and friends but that he has no where to stay. CSW gave patient resources for [facility]. Unfortunately without ID and any proof of identification, he will not be able to go to a shelter. CSW signing off. Please re consult if further needs arise.   Employment status:  Unemployed Forensic scientist:  Self Pay (Medicaid Pending) PT Recommendations:  Not assessed at this time Information / Referral to community  resources:  Shelter  Patient/Family's Response to care:  Patient thanked CSW for assistance   Patient/Family's Understanding of and Emotional Response to Diagnosis, Current Treatment, and Prognosis:  Patient understands he will have to discharge back to the street and is in agreement   Emotional Assessment Appearance:  Appears stated age Attitude/Demeanor/Rapport:    Affect (typically observed):  Guarded, Quiet Orientation:  Oriented to Self, Oriented to Place, Oriented to  Time, Oriented to Situation Alcohol / Substance use:  Not Applicable Psych involvement (Current and /or in the community):  No (Comment)  Discharge Needs  Concerns to be addressed:  Homelessness Readmission within the last 30 days:  No Current discharge risk:  Homeless Barriers to Discharge:  Continued Medical Work up   [employer], [place] 10/29/2018, 2:59 PM

## 2018-10-29 NOTE — Progress Notes (Signed)
[facility] [place] at [facility]   PATIENT NAME: Patrick Sparks    MR#:  [id number]  DATE OF BIRTH:  17-Feb-1986  SUBJECTIVE:  CHIEF COMPLAINT:   Chief Complaint  Patient presents with  . Shortness of Breath   -Patient still complaining of right-sided chest pain.  Now requiring 1 to 2 L oxygen  REVIEW OF SYSTEMS:  Review of Systems  Constitutional: Positive for fever. Negative for chills and malaise/fatigue.  HENT: Negative for congestion, ear discharge, hearing loss and nosebleeds.   Eyes: Negative for blurred vision and double vision.  Respiratory: Negative for cough, shortness of breath and wheezing.   Cardiovascular: Positive for chest pain. Negative for palpitations and leg swelling.  Gastrointestinal: Negative for abdominal pain, constipation, diarrhea, nausea and vomiting.  Genitourinary: Negative for dysuria.  Musculoskeletal: Negative for myalgias.  Neurological: Negative for dizziness, focal weakness, seizures, weakness and headaches.  Psychiatric/Behavioral: Negative for depression.    DRUG ALLERGIES:  No Known Allergies  VITALS:  Blood pressure 121/73, pulse 93, temperature 100.1 F (37.8 C), temperature source Oral, resp. rate 18, height 5\' 8"  (1.727 m), weight 104.1 kg, SpO2 90 %.  PHYSICAL EXAMINATION:  Physical Exam  GENERAL:  32 y.o.-year-old obese patient lying in the bed with no acute distress.  EYES: Pupils equal, round, reactive to light and accommodation. No scleral icterus. Extraocular muscles intact.  HEENT: Head atraumatic, normocephalic. Oropharynx and nasopharynx clear.  NECK:  Supple, no jugular venous distention. No thyroid enlargement, no tenderness.  LUNGS: Normal breath sounds bilaterally, no wheezing, rales,rhonchi or crepitation. No use of accessory muscles of respiration. No chest wall tenderness CARDIOVASCULAR: S1, S2 normal. No murmurs, rubs, or gallops.  ABDOMEN: Soft, nontender, nondistended. Bowel sounds  present. No organomegaly or mass.  EXTREMITIES: No pedal edema, cyanosis, or clubbing.  NEUROLOGIC: Cranial nerves II through XII are intact. Muscle strength 5/5 in all extremities. Sensation intact. Gait not checked.  PSYCHIATRIC: The patient is alert and oriented x 3.  SKIN: No obvious rash, lesion, or ulcer.    LABORATORY PANEL:   CBC Recent Labs  Lab 10/28/18 0603  WBC 10.1  HGB 14.4  HCT 43.2  PLT 240   ------------------------------------------------------------------------------------------------------------------  Chemistries  Recent Labs  Lab 10/26/18 0136  10/28/18 0603  NA 140   < > 133*  K 3.6   < > 4.1  CL 104   < > 99  CO2 25   < > 26  GLUCOSE 111*   < > 104*  BUN 8   < > 14  CREATININE 0.83   < > 0.79  CALCIUM 8.8*   < > 9.3  AST 22  --   --   ALT 24  --   --   ALKPHOS 43  --   --   BILITOT 1.1  --   --    < > = values in this interval not displayed.   ------------------------------------------------------------------------------------------------------------------  Cardiac Enzymes Recent Labs  Lab 10/27/18 1622  TROPONINI <0.03   ------------------------------------------------------------------------------------------------------------------  RADIOLOGY:  Dg Chest 2 View  Result Date: 10/29/2018 CLINICAL DATA:  Chest pain, shortness of breath. EXAM: CHEST - 2 VIEW COMPARISON:  Radiographs of October 27, 2018. FINDINGS: Stable cardiomegaly. No pneumothorax is noted. Increased bibasilar opacities are noted concerning for worsening atelectasis or edema with associated pleural effusions. Bony thorax is unremarkable. IMPRESSION: Increased bibasilar opacities as described above. Electronically Signed   By: Lupita Raider, M.D.   On: 10/29/2018 10:28  Dg Chest 2 View  Result Date: 10/27/2018 CLINICAL DATA:  Shortness of breath, chest pain EXAM: CHEST - 2 VIEW COMPARISON:  10/26/2018 FINDINGS: Bibasilar opacities likely atelectasis. Heart is normal  size. No effusions or acute bony abnormality. IMPRESSION: Bibasilar atelectasis. Electronically Signed   By: Charlett Nose M.D.   On: 10/27/2018 16:47   Ct Angio Chest Pe W And/or Wo Contrast  Result Date: 10/27/2018 CLINICAL DATA:  Cough and shortness of breath with chest wall pain EXAM: CT ANGIOGRAPHY CHEST WITH CONTRAST TECHNIQUE: Multidetector CT imaging of the chest was performed using the standard protocol during bolus administration of intravenous contrast. Multiplanar CT image reconstructions and MIPs were obtained to evaluate the vascular anatomy. CONTRAST:  75mL ISOVUE-370 IOPAMIDOL (ISOVUE-370) INJECTION 76% COMPARISON:  None. FINDINGS: Cardiovascular: --Pulmonary arteries: Contrast injection is sufficient to demonstrate satisfactory opacification of the pulmonary arteries to the segmental level.There are bilateral pulmonary emboli. On the right, there are emboli within segmental branches of the upper and middle lobes. On the left, the emboli are in the proximal segmental branches of the lower lobe. There is no evidence of right heart strain. The main pulmonary artery is within normal limits for size. --Aorta: Limited opacification of the aorta due to bolus timing optimization for the pulmonary arteries. Conventional 3 vessel aortic branching pattern. The aortic course and caliber are normal. There is no aortic atherosclerosis. --Heart: Mild cardiomegaly. No pericardial effusion. Mediastinum/Nodes: No mediastinal, hilar or axillary lymphadenopathy. The visualized thyroid and thoracic esophageal course are unremarkable. Lungs/Pleura: Bibasilar consolidation or atelectasis. No pleural effusion or pneumothorax. Upper Abdomen: Contrast bolus timing is not optimized for evaluation of the abdominal organs. Within this limitation, the visualized organs of the upper abdomen are normal. Musculoskeletal: No chest wall abnormality. No acute or significant osseous findings. Review of the MIP images confirms the  above findings. IMPRESSION: 1. Bilateral segmental pulmonary emboli, including the segmental branches of the right upper and middle lobes and the left lobe. 2. No CT evidence of right heart strain. 3. Bibasilar opacities which could indicate atelectasis or pneumonia. Critical Value/emergent results were called by telephone at the time of interpretation on 10/27/2018 at 6:41 pm to Dr. Minna Antis , who verbally acknowledged these results. Electronically Signed   By: Deatra Robinson M.D.   On: 10/27/2018 18:42   US Venous Img Lower Bilateral  Result Date: 10/27/2018 CLINICAL DATA:  Pulmonary embolus. EXAM: BILATERAL LOWER EXTREMITY VENOUS DOPPLER ULTRASOUND TECHNIQUE: Gray-scale sonography with graded compression, as well as color Doppler and duplex ultrasound were performed to evaluate the lower extremity deep venous systems from the level of the common femoral vein and including the common femoral, femoral, profunda femoral, popliteal and calf veins including the posterior tibial, peroneal and gastrocnemius veins when visible. The superficial great saphenous vein was also interrogated. Spectral Doppler was utilized to evaluate flow at rest and with distal augmentation maneuvers in the common femoral, femoral and popliteal veins. COMPARISON:  None. FINDINGS: RIGHT LOWER EXTREMITY Common Femoral Vein: No evidence of thrombus. Normal compressibility, respiratory phasicity and response to augmentation. Saphenofemoral Junction: No evidence of thrombus. Normal compressibility and flow on color Doppler imaging. Profunda Femoral Vein: No evidence of thrombus. Normal compressibility and flow on color Doppler imaging. Femoral Vein: No evidence of thrombus. Normal compressibility, respiratory phasicity and response to augmentation. Popliteal Vein: No evidence of thrombus. Normal compressibility, respiratory phasicity and response to augmentation. Calf Veins: No evidence of thrombus. Normal compressibility and flow on  color Doppler imaging. Superficial Great Saphenous Vein:  No evidence of thrombus. Normal compressibility. Venous Reflux:  None. Other Findings: Generalized slow flow. LEFT LOWER EXTREMITY Common Femoral Vein: No evidence of thrombus. Normal compressibility, respiratory phasicity and response to augmentation. Saphenofemoral Junction: No evidence of thrombus. Normal compressibility and flow on color Doppler imaging. Profunda Femoral Vein: No evidence of thrombus. Normal compressibility and flow on color Doppler imaging. Femoral Vein: No evidence of thrombus. Normal compressibility, respiratory phasicity and response to augmentation. Popliteal Vein: No evidence of thrombus. Normal compressibility, respiratory phasicity and response to augmentation. Calf Veins: No evidence of thrombus. Normal compressibility and flow on color Doppler imaging. Superficial Great Saphenous Vein: No evidence of thrombus. Normal compressibility. Venous Reflux:  None. Other Findings: Generalized slow flow. IMPRESSION: No evidence of deep venous thrombosis. Electronically Signed   By: Deatra Robinson M.D.   On: 10/27/2018 21:22    EKG:   Orders placed or performed during the hospital encounter of 10/27/18  . ED EKG within 10 minutes  . ED EKG within 10 minutes  . EKG 12-Lead  . EKG 12-Lead    ASSESSMENT AND PLAN:   32 year old male with past medical history significant for bipolar, schizophrenia, ongoing smoking and alcohol use presents to hospital secondary to chest pain and noted to have bilateral segmental pulmonary embolism  1.  Bilateral segmental pulmonary embolism-without any right heart strain-patient started on Eliquis -Currently requiring 1 to 2 L of oxygen which is acute.  Continue incentive spirometry -Dopplers negative for any DVT -If he is weaned off oxygen, can be discharged  2.  Low-grade temperatures-could be secondary to underlying clot -Check chest x-ray and urinalysis to rule out other infections.  3.   Alcohol abuse-no withdrawals.  Monitor for now, Ativan as needed  4.  DVT prophylaxis-patient already on Eliquis  Encourage ambulation and aim for discharge today     All the records are reviewed and case discussed with Care Management/Social Workerr. Management plans discussed with the patient, family and they are in agreement.  CODE STATUS: Full Code  TOTAL TIME TAKING CARE OF THIS PATIENT: 38 minutes.   POSSIBLE D/C IN 1-2 DAYS, DEPENDING ON CLINICAL CONDITION.   Enid Baas M.D on 10/29/2018 at 11:10 AM  Between 7am to 6pm - Pager - [phone]  After 6pm go to [email or web address] - password [email or web address]  [facility]  Office  [phone]  CC: Primary care physician; Patient, No Pcp Per

## 2018-10-30 LAB — CBC
HEMATOCRIT: 42.4 % (ref 39.0–52.0)
HEMOGLOBIN: 14.1 g/dL (ref 13.0–17.0)
MCH: 29.8 pg (ref 26.0–34.0)
MCHC: 33.3 g/dL (ref 30.0–36.0)
MCV: 89.6 fL (ref 80.0–100.0)
NRBC: 0 % (ref 0.0–0.2)
Platelets: 237 10*3/uL (ref 150–400)
RBC: 4.73 MIL/uL (ref 4.22–5.81)
RDW: 10.9 % — ABNORMAL LOW (ref 11.5–15.5)
WBC: 10.3 10*3/uL (ref 4.0–10.5)

## 2018-10-30 LAB — PROTEIN C ACTIVITY: PROTEIN C ACTIVITY: 153 % (ref 73–180)

## 2018-10-30 LAB — BASIC METABOLIC PANEL
Anion gap: 9 (ref 5–15)
BUN: 12 mg/dL (ref 6–20)
CO2: 27 mmol/L (ref 22–32)
Calcium: 8.8 mg/dL — ABNORMAL LOW (ref 8.9–10.3)
Chloride: 99 mmol/L (ref 98–111)
Creatinine, Ser: 0.85 mg/dL (ref 0.61–1.24)
GFR calc non Af Amer: >60 mL/min (ref >60)
GLUCOSE: 97 mg/dL (ref 70–99)
POTASSIUM: 4 mmol/L (ref 3.5–5.1)
Sodium: 135 mmol/L (ref 135–145)

## 2018-10-30 LAB — BETA-2-GLYCOPROTEIN I ABS, IGG/M/A
Beta-2-Glycoprotein I IgA: 71 GPI IgA units — ABNORMAL HIGH (ref 0–25)
Beta-2-Glycoprotein I IgM: 15 GPI IgM units (ref 0–32)

## 2018-10-30 LAB — PROTEIN S, TOTAL: Protein S Ag, Total: 126 % (ref 60–150)

## 2018-10-30 LAB — PROTEIN C, TOTAL: PROTEIN C, TOTAL: 119 % (ref 60–150)

## 2018-10-30 LAB — PROTEIN S ACTIVITY: Protein S Activity: 139 % (ref 63–140)

## 2018-10-30 MED ORDER — IBUPROFEN 400 MG PO TABS
600.0000 mg | ORAL_TABLET | Freq: Three times a day (TID) | ORAL | Status: DC
  Administered 2018-10-30: 10:00:00 600 mg via ORAL
  Filled 2018-10-30: qty 2

## 2018-10-30 MED ORDER — DIPHENHYDRAMINE HCL 25 MG PO CAPS
25.0000 mg | ORAL_CAPSULE | Freq: Once | ORAL | Status: AC
  Administered 2018-10-30: 10:00:00 25 mg via ORAL
  Filled 2018-10-30: qty 1

## 2018-10-30 MED ORDER — MOMETASONE FURO-FORMOTEROL FUM 200-5 MCG/ACT IN AERO
2.0000 | INHALATION_SPRAY | Freq: Two times a day (BID) | RESPIRATORY_TRACT | Status: DC
  Filled 2018-10-30: qty 8.8

## 2018-10-30 MED ORDER — CEFDINIR 300 MG PO CAPS: 300.0000 mg | ORAL_CAPSULE | Freq: Two times a day (BID) | ORAL | 0 refills | Status: DC

## 2018-10-30 NOTE — Discharge Summary (Signed)
[facility] [place] at [facility]   PATIENT NAME: Patrick Sparks    MR#:  [id number]  DATE OF BIRTH:  04-08-1986  DATE OF ADMISSION:  10/27/2018   ADMITTING PHYSICIAN: Milagros Loll, MD  DATE OF DISCHARGE: 10/30/2018 12:18 PM  PRIMARY CARE PHYSICIAN: Patient, No Pcp Per   ADMISSION DIAGNOSIS:   Pulmonary embolism (HCC) [I26.99] Multiple subsegmental pulmonary emboli without acute cor pulmonale [I26.94]  DISCHARGE DIAGNOSIS:   Active Problems:   Pulmonary embolism (HCC)   SECONDARY DIAGNOSIS:   Past Medical History:  Diagnosis Date  . Bipolar 1 disorder (HCC)   . Schizophrenia [facility])     HOSPITAL COURSE:   32 year old male with past medical history significant for bipolar, schizophrenia, ongoing smoking and alcohol use presents to hospital secondary to chest pain and noted to have bilateral segmental pulmonary embolism  1.  Bilateral segmental pulmonary embolism-without any right heart strain-patient started on Eliquis --Dopplers negative for any DVT -Initially requiring 1 to 2 L of oxygen via nasal cannula, currently weaned off oxygen -Still has some pleuritic chest pain and requesting narcotics.  2.  Low-grade temperatures-could be secondary to underlying clot -Chest x-ray with bibasilar atelectasis.  Will start on Ceftin  3.  Alcohol abuse-no withdrawals.  Urine tox also positive for cocaine  Ambulating well and ready for discharge today  DISCHARGE CONDITIONS:   Guarded  CONSULTS OBTAINED:   None  DRUG ALLERGIES:   No Known Allergies DISCHARGE MEDICATIONS:   Allergies as of 10/30/2018   No Known Allergies     Medication List    STOP taking these medications   albuterol 108 (90 Base) MCG/ACT inhaler Commonly known as:  PROVENTIL HFA;VENTOLIN HFA   azithromycin 250 MG tablet Commonly known as:  ZITHROMAX   guaiFENesin-codeine 100-10 MG/5ML syrup   OLANZapine 10 MG tablet Commonly known as:  ZYPREXA     TAKE  these medications   cefdinir 300 MG capsule Commonly known as:  OMNICEF Take 1 capsule (300 mg total) by mouth every 12 (twelve) hours for 5 days.   ELIQUIS STARTER PACK 5 MG Tabs Take as directed on package: start with two-5mg  tablets twice daily for 7 days. On day 8, switch to one-5mg  tablet twice daily.   Oxycodone HCl 10 MG Tabs Take 0.5 tablets (5 mg total) by mouth every 6 (six) hours as needed for moderate pain.        DISCHARGE INSTRUCTIONS:   1.  PCP follow-up in 1 to 2 weeks  DIET:   Cardiac diet  ACTIVITY:   Activity as tolerated  OXYGEN:   Home Oxygen: No.  Oxygen Delivery: room air  DISCHARGE LOCATION:   home   If you experience worsening of your admission symptoms, develop shortness of breath, life threatening emergency, suicidal or homicidal thoughts you must seek medical attention immediately by calling 911 or calling your MD immediately  if symptoms less severe.  You Must read complete instructions/literature along with all the possible adverse reactions/side effects for all the Medicines you take and that have been prescribed to you. Take any new Medicines after you have completely understood and accpet all the possible adverse reactions/side effects.   Please note  You were cared for by a hospitalist during your hospital stay. If you have any questions about your discharge medications or the care you received while you were in the hospital after you are discharged, you can call the unit and asked to speak with the hospitalist on call if the  hospitalist that took care of you is not available. Once you are discharged, your primary care physician will handle any further medical issues. Please note that NO REFILLS for any discharge medications will be authorized once you are discharged, as it is imperative that you return to your primary care physician (or establish a relationship with a primary care physician if you do not have one) for your aftercare needs  so that they can reassess your need for medications and monitor your lab values.    On the day of Discharge:  VITAL SIGNS:   Blood pressure 110/61, pulse 80, temperature 99.3 F (37.4 C), temperature source Oral, resp. rate 17, height 5\' 8"  (1.727 m), weight 104.1 kg, SpO2 95 %.  PHYSICAL EXAMINATION:    GENERAL:  32 y.o.-year-old obese patient lying in the bed with no acute distress.  EYES: Pupils equal, round, reactive to light and accommodation. No scleral icterus. Extraocular muscles intact.  HEENT: Head atraumatic, normocephalic. Oropharynx and nasopharynx clear.  NECK:  Supple, no jugular venous distention. No thyroid enlargement, no tenderness.  LUNGS: Normal breath sounds bilaterally, no wheezing, rales,rhonchi or crepitation. No use of accessory muscles of respiration. No chest wall tenderness CARDIOVASCULAR: S1, S2 normal. No murmurs, rubs, or gallops.  ABDOMEN: Soft, nontender, nondistended. Bowel sounds present. No organomegaly or mass.  EXTREMITIES: No pedal edema, cyanosis, or clubbing.  NEUROLOGIC: Cranial nerves II through XII are intact. Muscle strength 5/5 in all extremities. Sensation intact. Gait not checked.  PSYCHIATRIC: The patient is alert and oriented x 3.  SKIN: No obvious rash, lesion, or ulcer.  DATA REVIEW:   CBC Recent Labs  Lab 10/30/18 0751  WBC 10.3  HGB 14.1  HCT 42.4  PLT 237    Chemistries  Recent Labs  Lab 10/26/18 0136  10/30/18 0751  NA 140   < > 135  K 3.6   < > 4.0  CL 104   < > 99  CO2 25   < > 27  GLUCOSE 111*   < > 97  BUN 8   < > 12  CREATININE 0.83   < > 0.85  CALCIUM 8.8*   < > 8.8*  AST 22  --   --   ALT 24  --   --   ALKPHOS 43  --   --   BILITOT 1.1  --   --    < > = values in this interval not displayed.     Microbiology Results  No results found for this or any previous visit.  RADIOLOGY:  No results found.   Management plans discussed with the patient, family and they are in agreement.  CODE  STATUS:     Code Status Orders  (From admission, onward)         Start     Ordered   10/27/18 2002  Full code  Continuous     10/27/18 2003        Code Status History    Date Active Date Inactive Code Status Order ID Comments User Context   04/14/2018 1625 04/15/2018 1431 Full Code [id number]  Clapacs, Jackquline Denmark, MD Inpatient      TOTAL TIME TAKING CARE OF THIS PATIENT: 38 minutes.    Myha Arizpe M.D on 10/30/2018 at 1:20 PM  Between 7am to 6pm - Pager - [phone]  After 6pm go to [email or web address] - Social research officer, government  [facility]  Office  [phone]  CC: Primary care physician; Patient, No Pcp  Per   Note: This dictation was prepared with Dragon dictation along with smaller phrase technology. Any transcriptional errors that result from this process are unintentional.

## 2018-10-31 LAB — PTT-LA MIX: PTT-LA Mix: 70.9 s — ABNORMAL HIGH (ref 0.0–48.9)

## 2018-10-31 LAB — LUPUS ANTICOAGULANT PANEL
DRVVT: 41 s (ref 0.0–47.0)
PTT LA: 75.3 s — AB (ref 0.0–51.9)

## 2018-10-31 LAB — HEXAGONAL PHASE PHOSPHOLIPID: HEXAGONAL PHASE PHOSPHOLIPID: 0 s (ref 0–11)

## 2018-11-03 LAB — FACTOR 5 LEIDEN

## 2018-11-03 LAB — PROTHROMBIN GENE MUTATION

## 2018-11-04 ENCOUNTER — Other Ambulatory Visit: Payer: Self-pay

## 2018-11-04 ENCOUNTER — Inpatient Hospital Stay
Admission: AD | Admit: 2018-11-04 | Discharge: 2018-11-05 | DRG: 897 | Disposition: A | Discharge status: Home / Self Care | Payer: No Typology Code available for payment source | Source: Intra-hospital | Attending: Psychiatry | Admitting: Psychiatry

## 2018-11-04 ENCOUNTER — Emergency Department
Admission: EM | Admit: 2018-11-04 | Discharge: 2018-11-04 | Disposition: A | Discharge status: Other Acute Inpatient Hospital | Payer: Self-pay | Attending: Emergency Medicine | Admitting: Emergency Medicine

## 2018-11-04 DIAGNOSIS — F332 Major depressive disorder, recurrent severe without psychotic features: Secondary | ICD-10-CM

## 2018-11-04 DIAGNOSIS — F1994 Other psychoactive substance use, unspecified with psychoactive substance-induced mood disorder: Secondary | ICD-10-CM | POA: Diagnosis present

## 2018-11-04 DIAGNOSIS — F17210 Nicotine dependence, cigarettes, uncomplicated: Secondary | ICD-10-CM | POA: Insufficient documentation

## 2018-11-04 DIAGNOSIS — Z91018 Allergy to other foods: Secondary | ICD-10-CM

## 2018-11-04 DIAGNOSIS — Z9119 Patient's noncompliance with other medical treatment and regimen: Secondary | ICD-10-CM

## 2018-11-04 DIAGNOSIS — F1910 Other psychoactive substance abuse, uncomplicated: Secondary | ICD-10-CM

## 2018-11-04 DIAGNOSIS — I2699 Other pulmonary embolism without acute cor pulmonale: Secondary | ICD-10-CM | POA: Diagnosis present

## 2018-11-04 DIAGNOSIS — Z9114 Patient's other noncompliance with medication regimen: Secondary | ICD-10-CM

## 2018-11-04 DIAGNOSIS — F209 Schizophrenia, unspecified: Secondary | ICD-10-CM | POA: Diagnosis present

## 2018-11-04 DIAGNOSIS — F1010 Alcohol abuse, uncomplicated: Secondary | ICD-10-CM | POA: Diagnosis present

## 2018-11-04 DIAGNOSIS — R45851 Suicidal ideations: Secondary | ICD-10-CM

## 2018-11-04 DIAGNOSIS — F319 Bipolar disorder, unspecified: Secondary | ICD-10-CM | POA: Diagnosis present

## 2018-11-04 DIAGNOSIS — F1410 Cocaine abuse, uncomplicated: Secondary | ICD-10-CM | POA: Diagnosis present

## 2018-11-04 DIAGNOSIS — Z86711 Personal history of pulmonary embolism: Secondary | ICD-10-CM

## 2018-11-04 DIAGNOSIS — Z79899 Other long term (current) drug therapy: Secondary | ICD-10-CM | POA: Insufficient documentation

## 2018-11-04 HISTORY — DX: Personal history of pulmonary embolism: Z86.711

## 2018-11-04 LAB — URINE DRUG SCREEN, QUALITATIVE (ARMC ONLY)
AMPHETAMINES, UR SCREEN: NOT DETECTED
BENZODIAZEPINE, UR SCRN: NOT DETECTED
Barbiturates, Ur Screen: NOT DETECTED
Cannabinoid 50 Ng, Ur ~~LOC~~: NOT DETECTED
Cocaine Metabolite,Ur ~~LOC~~: POSITIVE — AB
MDMA (Ecstasy)Ur Screen: NOT DETECTED
METHADONE SCREEN, URINE: NOT DETECTED
Opiate, Ur Screen: NOT DETECTED
PHENCYCLIDINE (PCP) UR S: NOT DETECTED
Tricyclic, Ur Screen: NOT DETECTED

## 2018-11-04 LAB — COMPREHENSIVE METABOLIC PANEL
ALBUMIN: 3.2 g/dL — AB (ref 3.5–5.0)
ALK PHOS: 68 U/L (ref 38–126)
ALT: 29 U/L (ref 0–44)
ANION GAP: 12 (ref 5–15)
AST: 21 U/L (ref 15–41)
BUN: 8 mg/dL (ref 6–20)
CALCIUM: 8.9 mg/dL (ref 8.9–10.3)
CHLORIDE: 105 mmol/L (ref 98–111)
CO2: 23 mmol/L (ref 22–32)
Creatinine, Ser: 0.74 mg/dL (ref 0.61–1.24)
GFR calc Af Amer: >60 mL/min (ref >60)
GFR calc non Af Amer: >60 mL/min (ref >60)
GLUCOSE: 95 mg/dL (ref 70–99)
Potassium: 3.6 mmol/L (ref 3.5–5.1)
SODIUM: 140 mmol/L (ref 135–145)
Total Bilirubin: 0.5 mg/dL (ref 0.3–1.2)
Total Protein: 8 g/dL (ref 6.5–8.1)

## 2018-11-04 LAB — CBC
HCT: 39.6 % (ref 39.0–52.0)
Hemoglobin: 12.8 g/dL — ABNORMAL LOW (ref 13.0–17.0)
MCH: 29.5 pg (ref 26.0–34.0)
MCHC: 32.3 g/dL (ref 30.0–36.0)
MCV: 91.2 fL (ref 80.0–100.0)
NRBC: 0 % (ref 0.0–0.2)
PLATELETS: 243 10*3/uL (ref 150–400)
RBC: 4.34 MIL/uL (ref 4.22–5.81)
RDW: 11.4 % — AB (ref 11.5–15.5)
WBC: 8.8 10*3/uL (ref 4.0–10.5)

## 2018-11-04 LAB — ETHANOL: ALCOHOL ETHYL (B): 59 mg/dL — AB (ref <10)

## 2018-11-04 LAB — SALICYLATE LEVEL

## 2018-11-04 LAB — ACETAMINOPHEN LEVEL: Acetaminophen (Tylenol), Serum: <10 ug/mL — ABNORMAL LOW (ref 10–30)

## 2018-11-04 MED ORDER — TRAZODONE HCL 100 MG PO TABS
100.0000 mg | ORAL_TABLET | Freq: Every evening | ORAL | Status: DC | PRN
  Administered 2018-11-04: 100 mg via ORAL
  Filled 2018-11-04: qty 1

## 2018-11-04 MED ORDER — ACETAMINOPHEN 325 MG PO TABS: 650.0000 mg | ORAL_TABLET | Freq: Four times a day (QID) | ORAL | Status: DC | PRN

## 2018-11-04 MED ORDER — MAGNESIUM HYDROXIDE 400 MG/5ML PO SUSP: 30.0000 mL | Freq: Every day | ORAL | Status: DC | PRN

## 2018-11-04 MED ORDER — ALUM & MAG HYDROXIDE-SIMETH 200-200-20 MG/5ML PO SUSP: 30.0000 mL | ORAL | Status: DC | PRN

## 2018-11-04 MED ORDER — MOMETASONE FURO-FORMOTEROL FUM 200-5 MCG/ACT IN AERO
2.0000 | INHALATION_SPRAY | Freq: Two times a day (BID) | RESPIRATORY_TRACT | Status: DC
  Administered 2018-11-04: 2 via RESPIRATORY_TRACT
  Filled 2018-11-04: qty 8.8

## 2018-11-04 MED ORDER — HYDROXYZINE HCL 50 MG PO TABS
50.0000 mg | ORAL_TABLET | Freq: Three times a day (TID) | ORAL | Status: DC | PRN
  Administered 2018-11-04: 50 mg via ORAL
  Filled 2018-11-04: qty 1

## 2018-11-04 MED ORDER — APIXABAN 5 MG PO TABS
5.0000 mg | ORAL_TABLET | Freq: Two times a day (BID) | ORAL | Status: DC
  Administered 2018-11-04: 5 mg via ORAL
  Filled 2018-11-04: qty 1

## 2018-11-04 NOTE — Tx Team (Signed)
Initial Treatment Plan 11/04/2018 5:57 PM Joycelyn Man ZOX:[id number]    PATIENT STRESSORS: Financial difficulties Loss of home and suport from his family Medication change or noncompliance Substance abuse   PATIENT STRENGTHS: Capable of independent living Physical Health   PATIENT IDENTIFIED PROBLEMS: Substance Abuse   Homeless  Noncompliant with medications                 DISCHARGE CRITERIA:  Ability to meet basic life and health needs Adequate post-discharge living arrangements Improved stabilization in mood, thinking, and/or behavior Medical problems require only outpatient monitoring  PRELIMINARY DISCHARGE PLAN: Placement in alternative living arrangements  PATIENT/FAMILY INVOLVEMENT: This treatment plan has been presented to and reviewed with the patient, Guido Comp. The patient has been given the opportunity to ask questions and make suggestions.  Rex Kras, RN 11/04/2018, 5:57 PM

## 2018-11-04 NOTE — ED Provider Notes (Signed)
[facility] Emergency Department Provider Note  ____________________________________________  Time seen: Approximately 7:45 AM  I have reviewed the triage vital signs and the nursing notes.   HISTORY  Chief Complaint Psychiatric Evaluation   HPI Izik Bingman is a 32 y.o. male with history of bipolar disorder, schizophrenia, cocaine alcohol abuse, PE on Eliquis who presents for evaluation of suicidal ideation.  Patient reports that he has not been taking his psychiatric medications for several months.  For the last 2 days has been feeling more depressed and having suicidal thoughts.  He denies having a plan.  He reports prior suicide attempts.  He has been drinking and using cocaine.  Last alcohol use was last night.  Last cocaine 2 nights ago.  Patient denies any chest pain or shortness of breath, no medical complaints at this time.  He endorses compliance with his Eliquis.  Past Medical History:  Diagnosis Date  . Bipolar 1 disorder (HCC)   . History of pulmonary embolus (PE)   . Schizophrenia [facility])     Patient Active Problem List   Diagnosis Date Noted  . Pulmonary embolism (HCC) 10/27/2018  . Substance induced mood disorder (HCC) 04/15/2018  . Bipolar 1 disorder, depressed (HCC) 04/14/2018  . Alcohol abuse 04/14/2018  . Cocaine abuse (HCC) 04/14/2018    History reviewed. No pertinent surgical history.  Prior to Admission medications   Medication Sig Start Date End Date Taking? Authorizing Provider  ELIQUIS STARTER PACK (ELIQUIS STARTER PACK) 5 MG TABS Take as directed on package: start with two-5mg  tablets twice daily for 7 days. On day 8, switch to one-5mg  tablet twice daily. Patient taking differently: Take 5-10 mg by mouth See admin instructions. 10 mg by mouth 2 times daily for 7 days, then 5 mg by mouth 2 times daily 10/28/18  Yes Sudini, Srikar, MD  mometasone-formoterol (DULERA) 200-5 MCG/ACT AERO Inhale 2 puffs into the lungs 2  (two) times daily.   Yes [provider]  cefdinir (OMNICEF) 300 MG capsule Take 1 capsule (300 mg total) by mouth every 12 (twelve) hours for 5 days. Patient not taking: Reported on 11/04/2018 10/30/18 11/04/18  Enid Baas, MD  oxyCODONE 10 MG TABS Take 0.5 tablets (5 mg total) by mouth every 6 (six) hours as needed for moderate pain. Patient not taking: Reported on 11/04/2018 10/28/18   Milagros Loll, MD    Allergies Banana  No family history on file.  Social History Social History   Tobacco Use  . Smoking status: Current Every Day Smoker    Types: Cigarettes  . Smokeless tobacco: Never Used  Substance Use Topics  . Alcohol use: Yes  . Drug use: Yes    Types: Cocaine    Comment: Last used 2 days ago    Review of Systems  Constitutional: Negative for fever. Eyes: Negative for visual changes. ENT: Negative for sore throat. Neck: No neck pain  Cardiovascular: Negative for chest pain. Respiratory: Negative for shortness of breath. Gastrointestinal: Negative for abdominal pain, vomiting or diarrhea. Genitourinary: Negative for dysuria. Musculoskeletal: Negative for back pain. Skin: Negative for rash. Neurological: Negative for headaches, weakness or numbness. Psych: + depression and SI. No HI  ____________________________________________   PHYSICAL EXAM:  VITAL SIGNS: ED Triage Vitals  Enc Vitals Group     BP 11/04/18 0712 131/75     Pulse Rate 11/04/18 0712 81     Resp 11/04/18 0712 18     Temp 11/04/18 0712 98.4 F (36.9 C)  Temp Source 11/04/18 0712 Oral     SpO2 11/04/18 0712 95 %     Weight 11/04/18 0713 230 lb (104.3 kg)     Height 11/04/18 0713 5\' 8"  (1.727 m)     Head Circumference --      Peak Flow --      Pain Score 11/04/18 0713 0     Pain Loc --      Pain Edu? --      Excl. in GC? --     Constitutional: Patient is very somnolent but arousable and answers questions appropriately, no apparent distress  HEENT:      Head:  Normocephalic and atraumatic.         Eyes: Conjunctivae are normal. Sclera is non-icteric.       Mouth/Throat: Mucous membranes are moist.       Neck: Supple with no signs of meningismus. Cardiovascular: Regular rate and rhythm. No murmurs, gallops, or rubs. 2+ symmetrical distal pulses are present in all extremities. No JVD. Respiratory: Normal respiratory effort. Lungs are clear to auscultation bilaterally. No wheezes, crackles, or rhonchi.  Gastrointestinal: Soft, non tender, and non distended with positive bowel sounds. No rebound or guarding. Musculoskeletal: Nontender with normal range of motion in all extremities. No edema, cyanosis, or erythema of extremities. Neurologic: Normal speech and language. Face is symmetric. Moving all extremities. No gross focal neurologic deficits are appreciated. Skin: Skin is warm, dry and intact. No rash noted. Psychiatric: Mood and affect are normal. Speech and behavior are normal.  ____________________________________________   LABS (all labs ordered are listed, but only abnormal results are displayed)  Labs Reviewed  COMPREHENSIVE METABOLIC PANEL - Abnormal; Notable for the following components:      Result Value   Albumin 3.2 (*)    All other components within normal limits  ACETAMINOPHEN LEVEL - Abnormal; Notable for the following components:   Acetaminophen (Tylenol), Serum <10 (*)    All other components within normal limits  SALICYLATE LEVEL  ETHANOL  CBC  URINE DRUG SCREEN, QUALITATIVE ([facility] ONLY)   ____________________________________________  EKG  none  ____________________________________________  RADIOLOGY  none  ____________________________________________   PROCEDURES  Procedure(s) performed: None Procedures Critical Care performed:  None ____________________________________________   INITIAL IMPRESSION / ASSESSMENT AND PLAN / ED COURSE   32 y.o. male with history of bipolar disorder, schizophrenia, cocaine  alcohol abuse, PE on Eliquis who presents for evaluation of suicidal ideation and major depression.  Patient meets criteria for IVC.  Will get labs for medical clearance.  Will order patient's Eliquis. Will consult psych and TTS.       As part of my medical decision making, I reviewed the following data within the electronic MEDICAL RECORD NUMBER Nursing notes reviewed and incorporated, Labs reviewed , Old chart reviewed, A consult was requested and obtained from this/these consultant(s) TTS and psychiatry, Notes from prior ED visits and [place] Controlled Substance Database    Pertinent labs & imaging results that were available during my care of the patient were reviewed by me and considered in my medical decision making (see chart for details).    ____________________________________________   FINAL CLINICAL IMPRESSION(S) / ED DIAGNOSES  Final diagnoses:  Polysubstance abuse (HCC)  Severe episode of recurrent major depressive disorder, without psychotic features (HCC)  Suicidal ideation      NEW MEDICATIONS STARTED DURING THIS VISIT:  ED Discharge Orders    None       Note:  This document was prepared using Dragon  voice recognition software and may include unintentional dictation errors.    Nita Sickle, MD 11/04/18 [phone]

## 2018-11-04 NOTE — ED Notes (Signed)
Dr.Clapacs at bedside  

## 2018-11-04 NOTE — Consult Note (Signed)
[place] Psychiatry Consult   Reason for Consult: Consult for this 32 year old man who comes into the emergency room saying he is suicidal Referring Physician: Alfred Levins Patient Identification: Patrick Sparks MRN:  [id number] Principal Diagnosis: Bipolar 1 disorder, depressed ([place]) Diagnosis:   Patient Active Problem List   Diagnosis Date Noted  . Pulmonary embolism ([place]) [I26.99] 10/27/2018  . Substance induced mood disorder ([place]) [F19.94] 04/15/2018  . Bipolar 1 disorder, depressed ([place]) [F31.9] 04/14/2018  . Alcohol abuse [F10.10] 04/14/2018  . Cocaine abuse ([place]) [F14.10] 04/14/2018    Total Time spent with patient: 1 hour  Subjective:   Patrick Sparks is a 32 y.o. male patient admitted with "I am just having suicidal thoughts and thinking how to do it".  HPI: Patient seen chart reviewed.  32 year old man with a history of substance abuse and mood symptoms comes to the emergency room saying he is having suicidal ideation.  Patient tells me he has been depressed and sad and down for weeks.  Mood feels very negative.  He feels hopeless about his life.  Has no place to stay currently and feels like he has no support.  His sleep is poor but he feels tired all the time.  He says he is been having suicidal thoughts and having thoughts of various methods by which she might do it.  Denies he is having any hallucinations.  Patient admits to being on cocaine but says he has not used any in a couple days.  Says he drinks alcohol but only about a 40 a day.  Place down the substance abuse issues.  He says he has been taking his blood thinning medicine but is not currently on any psychiatric medicine.  Social history: Not currently working.  Mostly estranged from his family.  Homeless at the moment.  Medical history: Patient was in the hospital with a pulmonary embolism recently.  I do not think there was ever any consensus as to why he had a pulmonary embolism but he was  put on Eliquis which she should still be taking.  Substance abuse history: Documented past history of cocaine and alcohol abuse.  No history of delirium tremens or seizures.  Past Psychiatric History: Patient has been seen previously for mental health problems.  A diagnosis of bipolar disorder has been put forth at one point.  There have also been speculation that it was mostly substance-induced.  He has had a previous hospitalization for a very brief time here.  History of self-mutilation no known past actual suicide attempts.  Appears to be largely noncompliant with recommended psychiatric treatment.  Risk to Self: Suicidal Ideation: Yes-Currently Present Suicidal Intent: No-Not Currently/Within Last 6 Months Is patient at risk for suicide?: Yes Suicidal Plan?: No-Not Currently/Within Last 6 Months Access to Means: No What has been your use of drugs/alcohol within the last 12 months?: Cannabis & Cocaine How many times?: 1 Other Self Harm Risks: Reports of none Triggers for Past Attempts: Unknown Intentional Self Injurious Behavior: None Risk to Others:   Prior Inpatient Therapy:   Prior Outpatient Therapy:    Past Medical History:  Past Medical History:  Diagnosis Date  . Bipolar 1 disorder ([place])   . History of pulmonary embolus (PE)   . Schizophrenia ([place])    History reviewed. No pertinent surgical history. Family History: No family history on file. Family Psychiatric  History: Denies any Social History:  Social History   Substance and Sexual Activity  Alcohol Use Yes  Social History   Substance and Sexual Activity  Drug Use Yes  . Types: Cocaine   Comment: Last used 2 days ago    Social History   Socioeconomic History  . Marital status: Single    Spouse name: Not on file  . Number of children: Not on file  . Years of education: Not on file  . Highest education level: Not on file  Occupational History  . Not on file  Social Needs  . Financial resource  strain: Not on file  . Food insecurity:    Worry: Not on file    Inability: Not on file  . Transportation needs:    Medical: Not on file    Non-medical: Not on file  Tobacco Use  . Smoking status: Current Every Day Smoker    Types: Cigarettes  . Smokeless tobacco: Never Used  Substance and Sexual Activity  . Alcohol use: Yes  . Drug use: Yes    Types: Cocaine    Comment: Last used 2 days ago  . Sexual activity: Not on file  Lifestyle  . Physical activity:    Days per week: Not on file    Minutes per session: Not on file  . Stress: Not on file  Relationships  . Social connections:    Talks on phone: Not on file    Gets together: Not on file    Attends religious service: Not on file    Active member of club or organization: Not on file    Attends meetings of clubs or organizations: Not on file    Relationship status: Not on file  Other Topics Concern  . Not on file  Social History Narrative  . Not on file   Additional Social History:    Allergies:   Allergies  Allergen Reactions  . Banana Anaphylaxis    Labs:  Results for orders placed or performed during the hospital encounter of 11/04/18 (from the past 48 hour(s))  Comprehensive metabolic panel     Status: Abnormal   Collection Time: 11/04/18  7:18 AM  Result Value Ref Range   Sodium 140 135 - 145 mmol/L   Potassium 3.6 3.5 - 5.1 mmol/L   Chloride 105 98 - 111 mmol/L   CO2 23 22 - 32 mmol/L   Glucose, Bld 95 70 - 99 mg/dL   BUN 8 6 - 20 mg/dL   Creatinine, Ser 0.74 0.61 - 1.24 mg/dL   Calcium 8.9 8.9 - 10.3 mg/dL   Total Protein 8.0 6.5 - 8.1 g/dL   Albumin 3.2 (L) 3.5 - 5.0 g/dL   AST 21 15 - 41 U/L   ALT 29 0 - 44 U/L   Alkaline Phosphatase 68 38 - 126 U/L   Total Bilirubin 0.5 0.3 - 1.2 mg/dL   GFR calc non Af Amer >60 >60 mL/min   GFR calc Af Amer >60 >60 mL/min    Comment: (NOTE) The eGFR has been calculated using the CKD EPI equation. This calculation has not been validated in all clinical  situations. eGFR's persistently <60 mL/min signify possible Chronic Kidney Disease.    Anion gap 12 5 - 15    Comment: Performed at [facility], [address] [place], [place] [address]  Ethanol     Status: Abnormal   Collection Time: 11/04/18  7:18 AM  Result Value Ref Range   Alcohol, Ethyl (B) 59 (H) <10 mg/dL    Comment: (NOTE) Lowest detectable limit for serum alcohol is 10  mg/dL. For medical purposes only. Performed at [facility], [address] [place], [place] [address]   Salicylate level     Status: None   Collection Time: 11/04/18  7:18 AM  Result Value Ref Range   Salicylate Lvl <0.1 2.8 - 30.0 mg/dL    Comment: Performed at [facility], [address] [place], [place] [address]  Acetaminophen level     Status: Abnormal   Collection Time: 11/04/18  7:18 AM  Result Value Ref Range   Acetaminophen (Tylenol), Serum <10 (L) 10 - 30 ug/mL    Comment: (NOTE) Therapeutic concentrations vary significantly. A range of 10-30 ug/mL  may be an effective concentration for many patients. However, some  are best treated at concentrations outside of this range. Acetaminophen concentrations >150 ug/mL at 4 hours after ingestion  and >50 ug/mL at 12 hours after ingestion are often associated with  toxic reactions. Performed at [facility], [address] [place], [place] [address]   cbc     Status: Abnormal   Collection Time: 11/04/18  7:18 AM  Result Value Ref Range   WBC 8.8 4.0 - 10.5 K/uL   RBC 4.34 4.22 - 5.81 MIL/uL   Hemoglobin 12.8 (L) 13.0 - 17.0 g/dL   HCT 39.6 39.0 - 52.0 %   MCV 91.2 80.0 - 100.0 fL   MCH 29.5 26.0 - 34.0 pg   MCHC 32.3 30.0 - 36.0 g/dL   RDW 11.4 (L) 11.5 - 15.5 %   Platelets 243 150 - 400 K/uL   nRBC 0.0 0.0 - 0.2 %    Comment: Performed at [facility], [address] [place], [place] [address]  Urine Drug Screen, Qualitative     Status: Abnormal   Collection Time: 11/04/18  7:18 AM   Result Value Ref Range   Tricyclic, Ur Screen NONE DETECTED NONE DETECTED   Amphetamines, Ur Screen NONE DETECTED NONE DETECTED   MDMA (Ecstasy)Ur Screen NONE DETECTED NONE DETECTED   Cocaine Metabolite,Ur [place] POSITIVE (A) NONE DETECTED   Opiate, Ur Screen NONE DETECTED NONE DETECTED   Phencyclidine (PCP) Ur S NONE DETECTED NONE DETECTED   Cannabinoid 50 Ng, Ur [place] NONE DETECTED NONE DETECTED   Barbiturates, Ur Screen NONE DETECTED NONE DETECTED   Benzodiazepine, Ur Scrn NONE DETECTED NONE DETECTED   Methadone Scn, Ur NONE DETECTED NONE DETECTED    Comment: (NOTE) Tricyclics + metabolites, urine    Cutoff 1000 ng/mL Amphetamines + metabolites, urine  Cutoff 1000 ng/mL MDMA (Ecstasy), urine              Cutoff 500 ng/mL Cocaine Metabolite, urine          Cutoff 300 ng/mL Opiate + metabolites, urine        Cutoff 300 ng/mL Phencyclidine (PCP), urine         Cutoff 25 ng/mL Cannabinoid, urine                 Cutoff 50 ng/mL Barbiturates + metabolites, urine  Cutoff 200 ng/mL Benzodiazepine, urine              Cutoff 200 ng/mL Methadone, urine                   Cutoff 300 ng/mL The urine drug screen provides only a preliminary, unconfirmed analytical test result and should not be used for non-medical purposes. Clinical consideration and professional judgment should be applied to any positive drug screen result due to possible  interfering substances. A more specific alternate chemical method must be used in order to obtain a confirmed analytical result. Gas chromatography / mass spectrometry (GC/MS) is the preferred confirmat ory method. Performed at [facility], [address], [place], [place] [address]     Current Facility-Administered Medications  Medication Dose Route Frequency Provider Last Rate Last Dose  . apixaban (ELIQUIS) tablet 5 mg  5 mg Oral BID Alfred Levins, [place], MD   5 mg at 11/04/18 5643  . mometasone-formoterol (DULERA) 200-5 MCG/ACT inhaler 2 puff  2  puff Inhalation BID Rudene Re, MD   2 puff at 11/04/18 3295   Current Outpatient Medications  Medication Sig Dispense Refill  . ELIQUIS STARTER PACK (ELIQUIS STARTER PACK) 5 MG TABS Take as directed on package: start with two-91m tablets twice daily for 7 days. On day 8, switch to one-532mtablet twice daily. (Patient taking differently: Take 5-10 mg by mouth See admin instructions. 10 mg by mouth 2 times daily for 7 days, then 5 mg by mouth 2 times daily) 1 each 0  . mometasone-formoterol (DULERA) 200-5 MCG/ACT AERO Inhale 2 puffs into the lungs 2 (two) times daily.    . cefdinir (OMNICEF) 300 MG capsule Take 1 capsule (300 mg total) by mouth every 12 (twelve) hours for 5 days. (Patient not taking: Reported on 11/04/2018) 10 capsule 0  . oxyCODONE 10 MG TABS Take 0.5 tablets (5 mg total) by mouth every 6 (six) hours as needed for moderate pain. (Patient not taking: Reported on 11/04/2018) 20 tablet 0    Musculoskeletal: Strength & Muscle Tone: flaccid Gait & Station: normal Patient leans: N/A  Psychiatric Specialty Exam: Physical Exam  Nursing note and vitals reviewed. Constitutional: He appears well-developed and well-nourished.  HENT:  Head: Normocephalic and atraumatic.  Eyes: Pupils are equal, round, and reactive to light. Conjunctivae are normal.  Neck: Normal range of motion.  Cardiovascular: Regular rhythm and normal heart sounds.  Respiratory: Effort normal. No respiratory distress.  GI: Soft.  Musculoskeletal: Normal range of motion.  Neurological: He is alert.  Skin: Skin is warm and dry.  Psychiatric: His affect is blunt. His speech is delayed. He is slowed. He expresses impulsivity. He expresses suicidal ideation. He expresses suicidal plans. He exhibits abnormal recent memory.    Review of Systems  Constitutional: Negative.   HENT: Negative.   Eyes: Negative.   Respiratory: Negative.   Cardiovascular: Negative.   Gastrointestinal: Negative.   Musculoskeletal:  Negative.   Skin: Negative.   Neurological: Negative.   Psychiatric/Behavioral: Positive for depression, memory loss, substance abuse and suicidal ideas. Negative for hallucinations. The patient is nervous/anxious and has insomnia.     Blood pressure 131/75, pulse 81, temperature 98.4 F (36.9 C), temperature source Oral, resp. rate 18, height _0  (1.727 m), weight 104.3 kg, SpO2 95 %.Body mass index is 34.97 kg/m.  General Appearance: Fairly Groomed  Eye Contact:  Minimal  Speech:  Slow  Volume:  Decreased  Mood:  Depressed  Affect:  Constricted  Thought Process:  Coherent  Orientation:  Full (Time, Place, and Person)  Thought Content:  Logical  Suicidal Thoughts:  Yes.  with intent/plan  Homicidal Thoughts:  No  Memory:  Immediate;   Fair Recent;   Poor Remote;   Poor  Judgement:  Poor  Insight:  Shallow  Psychomotor Activity:  Decreased  Concentration:  Concentration: Fair  Recall:  Poor  Fund of Knowledge:  Fair  Language:  Fair  Akathisia:  No  Handed:  Right  AIMS (if indicated):     Assets:  Desire for Improvement Physical Health  ADL's:  Impaired  Cognition:  Impaired,  Mild  Sleep:        Treatment Plan Summary: Daily contact with patient to assess and evaluate symptoms and progress in treatment, Medication management and Plan Patient is continuing to endorse suicidal ideation and looks flat down and depressed.  Could all be substance related but he also has a past history of mood disorder.  Recommend admission to the inpatient psychiatric ward.  Case reviewed with TTS and emergency room doctor.  Orders will be placed for admission and full set of labs.  Patient aware of the plan.  Disposition: Recommend psychiatric Inpatient admission when medically cleared. Supportive therapy provided about ongoing stressors.  Alethia Berthold, MD 11/04/2018 12:45 PM

## 2018-11-04 NOTE — [facility] Group Notes (Signed)
LCSW Group Therapy Note  11/04/2018 1:00 pm  Type of Therapy/Topic:  Group Therapy:  Balance in Life  Participation Level:  Did Not Attend  Description of Group:    This group will address the concept of balance and how it feels and looks when one is unbalanced. Patients will be encouraged to process areas in their lives that are out of balance and identify reasons for remaining unbalanced. Facilitators will guide patients in utilizing problem-solving interventions to address and correct the stressor making their life unbalanced. Understanding and applying boundaries will be explored and addressed for obtaining and maintaining a balanced life. Patients will be encouraged to explore ways to assertively make their unbalanced needs known to significant others in their lives, using other group members and facilitator for support and feedback.  Therapeutic Goals: 1. Patient will identify two or more emotions or situations they have that consume much of in their lives. 2. Patient will identify signs/triggers that life has become out of balance:  3. Patient will identify two ways to set boundaries in order to achieve balance in their lives:  4. Patient will demonstrate ability to communicate their needs through discussion and/or role plays  Summary of Patient Progress:      Therapeutic Modalities:   Cognitive Behavioral Therapy Solution-Focused Therapy Assertiveness Training  Alease Frame, LCSW 11/04/2018 3:55 PM

## 2018-11-04 NOTE — BH Assessment (Signed)
Assessment Note  Patrick Sparks is an 32 y.o. male who presents to the ER due to voicing SI. Per the report of the patient, he do not remember the events that lead to him coming to the ER. He states, he remember "calling 911 but that's about I'm don't remember what happened after that. Patient further shared, this isn't the first time something like this has happened. He was admitted to [facility] BMU in the recent past due to "blacking out and saying I was going to kill myself but I can't remember what I said or did." He reports of one suicide in the past but was unsure "if it was an overdose or what. When I realized what was going on, I woke up down stairs [facility] BMU) and didn't know how I got there or when."  During the interview, the patient was calm, cooperative and pleasant. He was a poor historian of past inpatient treatments and the cause of it. Several times during the interview, the patient would fall asleep. Writer asked him to sit up in his bed to help with him been drowsy but he continued to "doze off." Patient reports of having no history of violence or aggression. He reports of having a court date today (11/04/2018) and requested ER staff to inform the courts.   Patient continues to voice SI and states he is afraid to be alone because he do not know what he would do. He denies HI and AV/H.  Diagnosis: Bipolar  Past Medical History:  Past Medical History:  Diagnosis Date  . Bipolar 1 disorder (HCC)   . History of pulmonary embolus (PE)   . Schizophrenia (HCC)     History reviewed. No pertinent surgical history.  Family History: No family history on file.  Social History:  reports that he has been smoking cigarettes. He has never used smokeless tobacco. He reports that he drinks alcohol. He reports that he has current or past drug history. Drug: Cocaine.  Additional Social History:  Alcohol / Drug Use Pain Medications: See PTA Prescriptions: See PTA Over the Counter: See  PTA History of alcohol / drug use?: Yes Longest period of sobriety (when/how long): Reports of none Negative Consequences of Use: (n/a) Withdrawal Symptoms: (n/a)  CIWA: CIWA-Ar BP: 131/75 Pulse Rate: 81 COWS:    Allergies:  Allergies  Allergen Reactions  . Banana Anaphylaxis    Home Medications:  (Not in a hospital admission)  OB/GYN Status:  No LMP for male patient.  General Assessment Data Location of Assessment: [facility] ED TTS Assessment: In system Is this a Tele or Face-to-Face Assessment?: Face-to-Face Is this an Initial Assessment or a Re-assessment for this encounter?: Initial Assessment Patient Accompanied by:: N/A Language Other than English: No Living Arrangements: Homeless/Shelter(Private Home) What gender do you identify as?: Male Marital status: Single Pregnancy Status: No Living Arrangements: Other (Comment)(Homeless) Can pt return to current living arrangement?: Yes Admission Status: Involuntary Petitioner: ED Attending Is patient capable of signing voluntary admission?: No(No under IVC) Referral Source: Self/Family/Friend Insurance type: Reports of none  Medical Screening Exam [facility] Walk-in ONLY) Medical Exam completed: Yes  Crisis Care Plan Living Arrangements: Other (Comment)(Homeless) Legal Guardian: Other:(Reports of none) Name of Psychiatrist: Reports of none Name of Therapist: Reports of none  Education Status Is patient currently in school?: No Is the patient employed, unemployed or receiving disability?: Unemployed  Risk to self with the past 6 months Suicidal Ideation: Yes-Currently Present Has patient been a risk to self within the past  6 months prior to admission? : Yes Suicidal Intent: No-Not Currently/Within Last 6 Months Has patient had any suicidal intent within the past 6 months prior to admission? : No Is patient at risk for suicide?: Yes Suicidal Plan?: No-Not Currently/Within Last 6 Months Has patient had any suicidal plan  within the past 6 months prior to admission? : No Access to Means: No What has been your use of drugs/alcohol within the last 12 months?: Cannabis & Cocaine Previous Attempts/Gestures: Yes How many times?: 1 Other Self Harm Risks: Reports of none Triggers for Past Attempts: Unknown Intentional Self Injurious Behavior: None Family Suicide History: No Recent stressful life event(s): Other (Comment) Persecutory voices/beliefs?: No Depression: Yes Depression Symptoms: Insomnia, Isolating, Feeling worthless/self pity Substance abuse history and/or treatment for substance abuse?: Yes Suicide prevention information given to non-admitted patients: Not applicable  Risk to Others within the past 6 months Homicidal Ideation: No Does patient have any lifetime risk of violence toward others beyond the six months prior to admission? : No Thoughts of Harm to Others: No Current Homicidal Intent: No Current Homicidal Plan: No Access to Homicidal Means: No Identified Victim: Reports of none History of harm to others?: No Assessment of Violence: None Noted Violent Behavior Description: Reports of none Does patient have access to weapons?: No Criminal Charges Pending?: No Does patient have a court date: Yes Court Date: 11/04/18 Is patient on probation?: No  Psychosis Hallucinations: None noted Delusions: None noted  Mental Status Report Appearance/Hygiene: Unremarkable, In scrubs Eye Contact: Fair Motor Activity: Freedom of movement, Unremarkable Speech: Logical/coherent, Unremarkable Level of Consciousness: Drowsy Mood: Depressed, Sad Affect: Appropriate to circumstance, Depressed, Sad Anxiety Level: Minimal Thought Processes: Coherent, Relevant Judgement: Partial Orientation: Person, Place, Time, Situation, Appropriate for developmental age Obsessive Compulsive Thoughts/Behaviors: Minimal  Cognitive Functioning Concentration: Normal Memory: Recent Intact, Remote Intact Is patient  IDD: No Insight: Fair Impulse Control: Fair Appetite: Good Have you had any weight changes? : No Change Sleep: No Change Total Hours of Sleep: 8 Vegetative Symptoms: None  ADLScreening [facility] Services) Patient's cognitive ability adequate to safely complete daily activities?: Yes Patient able to express need for assistance with ADLs?: Yes Independently performs ADLs?: Yes (appropriate for developmental age)  Prior Inpatient Therapy Prior Inpatient Therapy: Yes Prior Therapy Dates: 03/2018 Prior Therapy Facilty/Provider(s): [facility] BMU Reason for Treatment: Bipolar & Suicidal  Prior Outpatient Therapy Prior Outpatient Therapy: No Does patient have Intensive In-House Services?  : No Does patient have [facility] services? : No Does patient have P4CC services?: No  ADL Screening (condition at time of admission) Patient's cognitive ability adequate to safely complete daily activities?: Yes Is the patient deaf or have difficulty hearing?: No Does the patient have difficulty seeing, even when wearing glasses/contacts?: No Does the patient have difficulty concentrating, remembering, or making decisions?: No Patient able to express need for assistance with ADLs?: Yes Does the patient have difficulty dressing or bathing?: No Independently performs ADLs?: Yes (appropriate for developmental age) Does the patient have difficulty walking or climbing stairs?: No Weakness of Legs: None Weakness of Arms/Hands: None  Home Assistive Devices/Equipment Home Assistive Devices/Equipment: None  Therapy Consults (therapy consults require a physician order) PT Evaluation Needed: No OT Evalulation Needed: No SLP Evaluation Needed: No Abuse/Neglect Assessment (Assessment to be complete while patient is alone) Abuse/Neglect Assessment Can Be Completed: Yes Physical Abuse: Denies Verbal Abuse: Denies Sexual Abuse: Denies Exploitation of patient/patient's resources: Denies Self-Neglect:  Denies Values / Beliefs Cultural Requests During Hospitalization: None Spiritual Requests During  Hospitalization: None Consults Spiritual Care Consult Needed: No Social Work Consult Needed: No Merchant navy officer (For Healthcare) Does Patient Have a Medical Advance Directive?: No Would patient like information on creating a medical advance directive?: No - Patient declined       Child/Adolescent Assessment Running Away Risk: Denies(Patient is an adult)  Disposition:  Disposition Initial Assessment Completed for this Encounter: Yes  On Site Evaluation by:   Reviewed with Physician:    Lilyan Gilford MS, LCAS, LPC, NCC, CCSI Therapeutic Triage Specialist 11/04/2018 1:06 PM

## 2018-11-04 NOTE — Progress Notes (Addendum)
Received Patrick Sparks from the QUAd, alert and oriented x4. His behavior was negative along with asking how long will he be here; the he stated feeling better since his arrival to the ED.  He was evasive with answering the assessment questions.  He could not state why he was here in his own words. He endorsed feeling suicidal and hears voices at intervals. He denied presently being uicidal and feels safe here at the hospital. He is walking with a slight limp, trauma unknown and was recently treated for a PE. The skin assessment was completed, oriented to his new environment, ate dinner and now resting in his bed.

## 2018-11-04 NOTE — ED Triage Notes (Signed)
Pt is here voluntary with [employer] officer with thoughts of SI, denies harming self prior to arrival. Denies having a plan at this time.

## 2018-11-04 NOTE — ED Notes (Signed)
Pt transferred to BMU under IVC.  VS stable. Report given to Randa Evens, [place].  Home medications delivered to pharmacy. Belongings sent with patient.

## 2018-11-04 NOTE — ED Notes (Signed)
TTS at bedside. Maintained on 15 minute checks and observation by security camera for safety. 

## 2018-11-04 NOTE — BH Assessment (Signed)
Patient is to be admitted to [facility] by Dr. Toni Amend.  Attending Physician will be Dr. Johnella Moloney.   Patient has been assigned to room 304, by [facility] Charge Nurse Demetria.   ER staff is aware of the admission:  Emilie, ER Secretary    Dr. Don Perking, ER MD   Amy B., Patient's Nurse   Ethelene Browns, Patient Access.

## 2018-11-04 NOTE — ED Notes (Signed)
FIRST NURSE NOTE:  Pt arrived voluntarily with [employer] officer. Pt cooperative in lobby at this time.

## 2018-11-04 NOTE — ED Notes (Signed)
Pt asleep. Breakfast tray placed at bedside.   Maintained on 15 minute checks and observation by security camera for safety.

## 2018-11-05 DIAGNOSIS — F1994 Other psychoactive substance use, unspecified with psychoactive substance-induced mood disorder: Principal | ICD-10-CM

## 2018-11-05 LAB — LIPID PANEL
CHOL/HDL RATIO: 5 ratio
Cholesterol: 170 mg/dL (ref 0–200)
HDL: 34 mg/dL — AB (ref >40)
LDL CALC: 119 mg/dL — AB (ref 0–99)
TRIGLYCERIDES: 85 mg/dL (ref <150)
VLDL: 17 mg/dL (ref 0–40)

## 2018-11-05 LAB — TSH: TSH: 0.689 u[IU]/mL (ref 0.350–4.500)

## 2018-11-05 LAB — HEMOGLOBIN A1C
Hgb A1c MFr Bld: 4.6 % — ABNORMAL LOW (ref 4.8–5.6)
Mean Plasma Glucose: 85.32 mg/dL

## 2018-11-05 MED ORDER — OLANZAPINE 5 MG PO TABS: 5.0000 mg | ORAL_TABLET | Freq: Every day | ORAL | 0 refills | Status: AC

## 2018-11-05 NOTE — BHH Suicide Risk Assessment (Signed)
[facility] Discharge Suicide Risk Assessment   Principal Problem: Substance induced mood disorder (HCC) D Mental Status Per Nursing Assessment::   On Admission:  Suicidal ideation indicated by patient, Self-harm thoughts  Demographic Factors:  Male and Unemployed  Loss Factors: Legal issues  Historical Factors: Impulsivity  Risk Reduction Factors:   Living with another person, especially a relative, Positive social support and Positive coping skills or problem solving skills  Continued Clinical Symptoms:  Alcohol/Substance Abuse/Dependencies  Cognitive Features That Contribute To Risk:  None    Suicide Risk:  Minimal Acute Risk: No identifiable suicidal ideation.     Haskell Riling, MD 11/05/2018, 9:23 AM

## 2018-11-05 NOTE — Plan of Care (Signed)
Newly admitted. Irritable, guarded, angry. Not willing to participate in group activities

## 2018-11-05 NOTE — Progress Notes (Addendum)
Patient was in and out of his room but unable to participate in group activities. Mostly in bed, guarded, rude and demanding. Avoiding conversations with staff. Presented to the nurses station cursing, stating that  He does not need to be here. He appeared disheveled with poor hygiene. Irritable and unwilling to follow unit expectations.  Patient went to the dayroom, had a snack and returned to his bed. Received Trazodone and Vistaril per his request and fell asleep later. Currently in bed and remains asleep. No sign of distress. Staff continue to monitor for safety and potential needs.  0630: Patient slept throughout the night. Refused to get up for morning routines: refused vital signs upon multiple attempts.

## 2018-11-05 NOTE — Progress Notes (Signed)
  [facility] Adult Case Management Discharge Plan :  Will you be returning to the same living situation after discharge:  Yes,  Shelter At discharge, do you have transportation home?: Yes,  Bus Do you have the ability to pay for your medications: Yes,  Referred to medication management clinic  Release of information consent forms completed and in the chart;  Patient's signature needed at discharge.  Patient to Follow up at: Follow-up Information    [facility] Go on 11/10/2018.   Why:  Please follow up with Unk Pinto on Wednesday November 10, 2018. He will come and pick you up at 7am. His contact number is [phone]. Thank you. Contact information: [address] [place] [address] [phone]           Next level of care provider has access to [place] [facility]:no  Safety Planning and Suicide Prevention discussed: Yes,  Patient refused family contact  Have you used any form of tobacco in the last 30 days? (Cigarettes, Smokeless Tobacco, Cigars, and/or Pipes): Yes  Has patient been referred to the Quitline?: Patient refused referral  Patient has been referred for addiction treatment: Yes  Johny Shears, LCSW 11/05/2018, 9:39 AM

## 2018-11-05 NOTE — Progress Notes (Addendum)
Received Patrick Sparks this AM after breakfast, he verbali=zed a desire to be discharge. He denied all of the psychiatric symptoms and feels safe to be discharge home. He received his discharge order, prescription and his personal belongings were returned. He was discharged to his friend via car from the visitors entrance at 1020 hrs without incident.  He claimed 2 rings were missing which was in his jeans pocket. The rings were not listed on his clothing list, the washer and dryer was checked and the nurses station for a container. He was given the directors contact information for follow-up next week.

## 2018-11-05 NOTE — Discharge Summary (Signed)
Physician Discharge Summary Note  Patient:  Patrick Sparks is an 32 y.o., male MRN:  [id number] DOB:  03/14/1986 Patient phone:  [phone] (home)  Patient address:   [address] [place] [address],  Total Time spent with patient: 45 minutes  Date of Admission:  11/04/2018 Date of Discharge: 11/05/18  Reason for Admission:  SI  Principal Problem: Substance induced mood disorder [facility]) Discharge Diagnoses: Patient Active Problem List   Diagnosis Date Noted  . Substance induced mood disorder (HCC) [F19.94] 04/15/2018    Priority: High  . Alcohol abuse [F10.10] 04/14/2018    Priority: High  . Cocaine abuse (HCC) [F14.10] 04/14/2018    Priority: High  . Pulmonary embolism (HCC) [I26.99] 10/27/2018    Past Psychiatric History: See H&P  Past Medical History:  Past Medical History:  Diagnosis Date  . Bipolar 1 disorder (HCC)   . History of pulmonary embolus (PE)   . Schizophrenia (HCC)    History reviewed. No pertinent surgical history. Family History: History reviewed. No pertinent family history. Family Psychiatric  History: See H&P Social History:  Social History   Substance and Sexual Activity  Alcohol Use Yes     Social History   Substance and Sexual Activity  Drug Use Yes  . Types: Cocaine   Comment: Last used 2 days ago    Social History   Socioeconomic History  . Marital status: Single    Spouse name: Not on file  . Number of children: Not on file  . Years of education: Not on file  . Highest education level: Not on file  Occupational History  . Not on file  Social Needs  . Financial resource strain: Not on file  . Food insecurity:    Worry: Not on file    Inability: Not on file  . Transportation needs:    Medical: Not on file    Non-medical: Not on file  Tobacco Use  . Smoking status: Current Every Day Smoker    Types: Cigarettes  . Smokeless tobacco: Never Used  Substance and Sexual Activity  . Alcohol use: Yes  . Drug use:  Yes    Types: Cocaine    Comment: Last used 2 days ago  . Sexual activity: Not on file  Lifestyle  . Physical activity:    Days per week: Not on file    Minutes per session: Not on file  . Stress: Not on file  Relationships  . Social connections:    Talks on phone: Not on file    Gets together: Not on file    Attends religious service: Not on file    Active member of club or organization: Not on file    Attends meetings of clubs or organizations: Not on file    Relationship status: Not on file  Other Topics Concern  . Not on file  Social History Narrative  . Not on file    Hospital Course:  Please see H&p as he was discharged same day.   The patient is at low risk of imminent suicide. Patient denied thoughts, intent, or plan for harm to self or others, expressed significant future orientation, and expressed an ability to mobilize assistance for his needs. he is presently void of any contributing psychiatric symptoms, cognitive difficulties, or substance use which would elevate his risk for lethality. Chronic risk for lethality is elevated in light of substance abuse and poor insight into it. The chronic risk is presently mitigated by his ongoing desire and  engagement in [facility] treatment and mobilization of support from family and friends. Chronic risk may elevate if he experiences any significant loss or worsening of symptoms, which can be managed and monitored through outpatient providers. At this time,a cute risk for lethality is low and he is stable for ongoing outpatient management.   Modifiable risk factors were addressed during this hospitalization through appropriate pharmacotherapy and establishment of outpatient follow-up treatment. Some risk factors for suicide are situational (i.e. Unstable housing) or related personality pathology (i.e. Poor coping mechanisms) and thus cannot be further mitigated by continued hospitalization in this setting.    Physical Findings: AIMS:  , ,  ,   ,    CIWA:    COWS:      Psychiatric Specialty Exam:  SEE H&P as he was discharged same day as intake                                                              Have you used any form of tobacco in the last 30 days? (Cigarettes, Smokeless Tobacco, Cigars, and/or Pipes): Yes  Has this patient used any form of tobacco in the last 30 days? (Cigarettes, Smokeless Tobacco, Cigars, and/or Pipes) Yes, Yes, A prescription for an FDA-approved tobacco cessation medication was offered at discharge and the patient refused  Blood Alcohol level:  Lab Results  Component Value Date   ETH 59 (H) 11/04/2018   ETH 200 (H) 04/14/2018    Metabolic Disorder Labs:  Lab Results  Component Value Date   HGBA1C 4.6 (L) 11/04/2018   MPG 85.32 11/04/2018   MPG 76.71 04/15/2018   No results found for: PROLACTIN Lab Results  Component Value Date   CHOL 170 11/04/2018   TRIG 85 11/04/2018   HDL 34 (L) 11/04/2018   CHOLHDL 5.0 11/04/2018   VLDL 17 11/04/2018   LDLCALC 119 (H) 11/04/2018   LDLCALC 142 (H) 04/15/2018    See Psychiatric Specialty Exam and Suicide Risk Assessment completed by Attending Physician prior to discharge.  Discharge destination:  Home  Is patient on multiple antipsychotic therapies at discharge:  No   Has Patient had three or more failed trials of antipsychotic monotherapy by history:  No  Recommended Plan for Multiple Antipsychotic Therapies: NA  Discharge Instructions    Increase activity slowly   Complete by:  As directed      Allergies as of 11/05/2018      Reactions   Banana Anaphylaxis      Medication List    STOP taking these medications   cefdinir 300 MG capsule Commonly known as:  OMNICEF   Oxycodone HCl 10 MG Tabs     TAKE these medications     Indication  ELIQUIS STARTER PACK 5 MG Tabs Take as directed on package: start with two-5mg  tablets twice daily for 7 days. On day 8, switch to one-5mg  tablet twice  daily. What changed:    how much to take  how to take this  when to take this  additional instructions  Indication:  PE   mometasone-formoterol 200-5 MCG/ACT Aero Commonly known as:  DULERA Inhale 2 puffs into the lungs 2 (two) times daily.  Indication:  Asthma   OLANZapine 5 MG tablet Commonly known as:  ZYPREXA Take 1 tablet (5 mg total)  by mouth at bedtime.  Indication:  mood      Follow-up Information    [facility] Go on 11/10/2018.   Why:  Please follow up with Unk Pinto on Wednesday November 10, 2018. He will come and pick you up at 7am. His contact number is [phone]. Thank you. Contact information: [address] [place] [address] [phone]            Signed: Haskell Riling, MD 11/05/2018, 2:16 PM

## 2018-11-05 NOTE — H&P (Signed)
Psychiatric Admission Assessment Adult  Patient Identification: Patrick Sparks MRN:  [id number] Date of Evaluation:  11/05/2018 Chief Complaint:  SI Principal Diagnosis: Substance induced mood disorder ([place]) Diagnosis:   Patient Active Problem List   Diagnosis Date Noted  . Substance induced mood disorder ([place]) [F19.94] 04/15/2018    Priority: High  . Alcohol abuse [F10.10] 04/14/2018    Priority: High  . Cocaine abuse ([place]) [F14.10] 04/14/2018    Priority: High  . Pulmonary embolism ([place]) [I26.99] 10/27/2018   History of Present Illness: 32 yo male admitted due to voicing SI with no specific plan. He is known to this provider from previous admission in April for similar situation. He has long history of heavy cocaine use. HE continues to have very poor insight into this and declines any services for it. He states that he has been compliant with Zyprexa however this is likely to be untrue as he has not followed up and told other providers that he has not been taking it. He is restless today and wanting to discharge. This was very similar to previous admission where he wanted to leave the next day. He is calm and pleasant during assessment today. He states that he was "just feeling depressed and lonely at that moment." HE states, "I was thinking about suicide at that time but not anymore. HE sates that he lives with his sister and a bunch of kids and can get hectic as he stays on the couch. He states, "I just needed a night away alone." HE states that he is going to appear his disability and try to get his own place. He has upcoming court hearings that he wants to take care of. One is in November and we looked up another which is in January. He adamantly denies SI or any thoughts of self harm today. He states that he has not used cocaine "in a long time." His UDS is positive for cocaine. He also states that he does not drink that much but "before coming it I drank a little too much." He  states that he slept well last night and feels much better. Denies feeling depressed. He does not appear manic or psychotic. He denies AH, VH. He met with Lanae Boast with [facility] and plans to follow up with him. HE requests discharge today. He does not meet IVC criteria.   Associated Signs/Symptoms: Depression Symptoms:  Denies (Hypo) Manic Symptoms:  Denies Anxiety Symptoms:  Denies Psychotic Symptoms:  Denies PTSD Symptoms: Negative Total Time spent with patient: 45 minutes  Past Psychiatric History: Pt reports history of "bipolar schizophrenia." He used to see someone at [facility] but usually noncompliant with follow up. HE was on Zyprexa and feels that is helpful. Denies past suicide attempts. He has not had any hospitalizations since April 2019 at [facility].   Is the patient at risk to self? No.  Has the patient been a risk to self in the past 6 months? No.  Has the patient been a risk to self within the distant past? No.  Is the patient a risk to others? No.  Has the patient been a risk to others in the past 6 months? No.  Has the patient been a risk to others within the distant past? No.   Alcohol Screening: 1. How often do you have a drink containing alcohol?: 4 or more times a week 2. How many drinks containing alcohol do you have on a typical day when you are drinking?: 5 or 6 3. How  often do you have six or more drinks on one occasion?: Daily or almost daily AUDIT-C Score: 10 4. How often during the last year have you found that you were not able to stop drinking once you had started?: Weekly 5. How often during the last year have you failed to do what was normally expected from you becasue of drinking?: Weekly 6. How often during the last year have you needed a first drink in the morning to get yourself going after a heavy drinking session?: Weekly 7. How often during the last year have you had a feeling of guilt of remorse after drinking?: Never 8. How often during the last year have you  been unable to remember what happened the night before because you had been drinking?: Weekly 9. Have you or someone else been injured as a result of your drinking?: No 10. Has a relative or friend or a doctor or another health worker been concerned about your drinking or suggested you cut down?: Yes, but not in the last year Alcohol Use Disorder Identification Test Final Score (AUDIT): 24 Intervention/Follow-up: Alcohol Education Substance Abuse History in the last 12 months:  Yes.  alcohol and crack use which he greatly minimizes Consequences of Substance Abuse: Blackouts:  yes Previous Psychotropic Medications: Yes  Psychological Evaluations: Yes  Past Medical History:  Past Medical History:  Diagnosis Date  . Bipolar 1 disorder ([place])   . History of pulmonary embolus ([place])   . Schizophrenia ([place])    History reviewed. No pertinent surgical history. Family History: History reviewed. No pertinent family history. Family Psychiatric  History: Unknown Tobacco Screening: Have you used any form of tobacco in the last 30 days? (Cigarettes, Smokeless Tobacco, Cigars, and/or Pipes): Yes Tobacco use, Select all that apply: 5 or more cigarettes per day Are you interested in Tobacco Cessation Medications?: No, patient refused Counseled patient on smoking cessation including recognizing danger situations, developing coping skills and basic information about quitting provided: Yes Social History: Born and raised in [place]. He is not working. He got denied from disability. He has a 24 yo daughter that he sees occasionally. He lives with his sister on the couch currently. HE does have legal charges.   Additional Social History:    Specify valuables returned: See valuable form                      Allergies:   Allergies  Allergen Reactions  . Banana Anaphylaxis   Lab Results:  Results for orders placed or performed during the hospital encounter of 11/04/18 (from the past 48 hour(s))   Hemoglobin A1c     Status: Abnormal   Collection Time: 11/04/18  7:18 AM  Result Value Ref Range   Hgb A1c MFr Bld 4.6 (L) 4.8 - 5.6 %    Comment: (NOTE) Pre diabetes:          5.7%-6.4% Diabetes:              >6.4% Glycemic control for   <7.0% adults with diabetes    Mean Plasma Glucose 85.32 mg/dL    Comment: Performed at [facility] [address] [place], [place] [address]  Lipid panel     Status: Abnormal   Collection Time: 11/04/18  7:18 AM  Result Value Ref Range   Cholesterol 170 0 - 200 mg/dL   Triglycerides 85 <150 mg/dL   HDL 34 (L) >40 mg/dL   Total CHOL/HDL Ratio 5.0 RATIO   VLDL  17 0 - 40 mg/dL   LDL Cholesterol 119 (H) 0 - 99 mg/dL    Comment:        Total Cholesterol/HDL:CHD Risk Coronary Heart Disease Risk Table                     Men   Women  1/2 Average Risk   3.4   3.3  Average Risk       5.0   4.4  2 X Average Risk   9.6   7.1  3 X Average Risk  23.4   11.0        Use the calculated Patient Ratio above and the CHD Risk Table to determine the patient's CHD Risk.        ATP III CLASSIFICATION (LDL):  <100     mg/dL   Optimal  100-129  mg/dL   Near or Above                    Optimal  130-159  mg/dL   Borderline  160-189  mg/dL   High  >190     mg/dL   Very High Performed at [facility], [address] [place], [place] [address]   TSH     Status: None   Collection Time: 11/04/18  7:18 AM  Result Value Ref Range   TSH 0.689 0.350 - 4.500 uIU/mL    Comment: Performed by a 3rd Generation assay with a functional sensitivity of <=0.01 uIU/mL. Performed at [facility], [address] [place], [place] [address]     Blood Alcohol level:  Lab Results  Component Value Date   ETH 59 (H) 11/04/2018   ETH 200 (H) 52/84/1324    Metabolic Disorder Labs:  Lab Results  Component Value Date   HGBA1C 4.6 (L) 11/04/2018   MPG 85.32 11/04/2018   MPG 76.71 04/15/2018   No results found for: PROLACTIN Lab Results   Component Value Date   CHOL 170 11/04/2018   TRIG 85 11/04/2018   HDL 34 (L) 11/04/2018   CHOLHDL 5.0 11/04/2018   VLDL 17 11/04/2018   LDLCALC 119 (H) 11/04/2018   LDLCALC 142 (H) 04/15/2018    Current Medications: No current facility-administered medications for this encounter.    Current Outpatient Medications  Medication Sig Dispense Refill  . ELIQUIS STARTER PACK (ELIQUIS STARTER PACK) 5 MG TABS Take as directed on package: start with two-42m tablets twice daily for 7 days. On day 8, switch to one-524mtablet twice daily. (Patient taking differently: Take 5-10 mg by mouth See admin instructions. 10 mg by mouth 2 times daily for 7 days, then 5 mg by mouth 2 times daily) 1 each 0  . mometasone-formoterol (DULERA) 200-5 MCG/ACT AERO Inhale 2 puffs into the lungs 2 (two) times daily.    . Marland KitchenLANZapine (ZYPREXA) 5 MG tablet Take 1 tablet (5 mg total) by mouth at bedtime. 30 tablet 0   PTA Medications: No medications prior to admission.    Musculoskeletal: Strength & Muscle Tone: within normal limits Gait & Station: normal Patient leans: N/A  Psychiatric Specialty Exam: Physical Exam  Nursing note and vitals reviewed.   Review of Systems  All other systems reviewed and are negative.   Blood pressure 113/64, pulse 64, temperature 98.4 F (36.9 C), temperature source Oral, resp. rate 18, height '5\' 8"'  (1.727 m), weight 95.3 kg, SpO2 94 %.Body mass index is 31.93 kg/m.  General Appearance: Casual  Eye Contact:  Good  Speech:  Clear and Coherent  Volume:  Normal  Mood:  Euthymic  Affect:  Appropriate  Thought Process:  Coherent and Goal Directed  Orientation:  Full (Time, Place, and Person)  Thought Content:  Logical  Suicidal Thoughts:  No  Homicidal Thoughts:  No  Memory:  Immediate;   Fair  Judgement:  Impaired  Insight:  Lacking  Psychomotor Activity:  NA  Concentration:  Concentration: Fair  Recall:  [employer] of Knowledge:  Fair  Language:  Fair  Akathisia:   No      Assets:  Resilience  ADL's:  Intact  Cognition:  WNL  Sleep:  Number of Hours: 7.75    Treatment Plan Summary: 32 yo male admitted due to voicing vague SI. He has long history of alcohol and crack cocaine use but continues to greatly minimize this. Diagnosis is unclear due to long history of drug use with very little sobriety. He has poor compliance with meds and follow up. He denies all symptosm and no longer feeling suicidal. This is the same presentation he had in April. He request prescription for Zyprexa and will follow up with [facility]. EH has not had any unsafe behaviors on the unit. He is future oriented. He has been able to come to the ED when feeling unsafe and agrees to this again. He does not meet IVC criteria and will be discharged today.   Plan:  Mood likely substance induced -Will given Script for Zyprexa 5 mg qhs  Dispo -discharge home today per his request. He does not want to wait for 7 day supply   I certify that inpatient services furnished can reasonably be expected to improve the patient's condition.    Marylin Crosby, MD 11/8/20192:05 PM

## 2018-11-05 NOTE — [facility] Suicide Risk Assessment (Signed)
[facility] INPATIENT:  Family/Significant Other Suicide Prevention Education  Suicide Prevention Education:  Patient Refusal for Family/Significant Other Suicide Prevention Education: The patient Patrick Sparks has refused to provide written consent for family/significant other to be provided Family/Significant Other Suicide Prevention Education during admission and/or prior to discharge.  Physician notified.  Johny Shears 11/05/2018, 9:38 AM

## 2019-03-06 ENCOUNTER — Emergency Department (HOSPITAL_COMMUNITY): Payer: Self-pay

## 2019-03-06 ENCOUNTER — Emergency Department
Admission: EM | Admit: 2019-03-06 | Discharge: 2019-03-06 | Disposition: A | Discharge status: Other Acute Inpatient Hospital | Payer: Self-pay | Attending: Emergency Medicine | Admitting: Emergency Medicine

## 2019-03-06 ENCOUNTER — Encounter (HOSPITAL_COMMUNITY): Payer: Self-pay | Admitting: Emergency Medicine

## 2019-03-06 ENCOUNTER — Inpatient Hospital Stay (HOSPITAL_COMMUNITY)
Admission: EM | Admit: 2019-03-06 | Discharge: 2019-03-30 | DRG: 082 | Disposition: E | Payer: Self-pay | Attending: Surgery | Admitting: Surgery

## 2019-03-06 DIAGNOSIS — W3400XA Accidental discharge from unspecified firearms or gun, initial encounter: Secondary | ICD-10-CM

## 2019-03-06 DIAGNOSIS — S065X9A Traumatic subdural hemorrhage with loss of consciousness of unspecified duration, initial encounter: Secondary | ICD-10-CM | POA: Diagnosis present

## 2019-03-06 DIAGNOSIS — G9382 Brain death: Secondary | ICD-10-CM | POA: Diagnosis not present

## 2019-03-06 DIAGNOSIS — Z91018 Allergy to other foods: Secondary | ICD-10-CM

## 2019-03-06 DIAGNOSIS — F209 Schizophrenia, unspecified: Secondary | ICD-10-CM | POA: Insufficient documentation

## 2019-03-06 DIAGNOSIS — G935 Compression of brain: Secondary | ICD-10-CM | POA: Diagnosis present

## 2019-03-06 DIAGNOSIS — Z87891 Personal history of nicotine dependence: Secondary | ICD-10-CM

## 2019-03-06 DIAGNOSIS — F17210 Nicotine dependence, cigarettes, uncomplicated: Secondary | ICD-10-CM | POA: Insufficient documentation

## 2019-03-06 DIAGNOSIS — R4189 Other symptoms and signs involving cognitive functions and awareness: Secondary | ICD-10-CM

## 2019-03-06 DIAGNOSIS — Z86711 Personal history of pulmonary embolism: Secondary | ICD-10-CM

## 2019-03-06 DIAGNOSIS — I959 Hypotension, unspecified: Secondary | ICD-10-CM | POA: Diagnosis not present

## 2019-03-06 DIAGNOSIS — F1410 Cocaine abuse, uncomplicated: Secondary | ICD-10-CM | POA: Insufficient documentation

## 2019-03-06 DIAGNOSIS — Z9911 Dependence on respirator [ventilator] status: Secondary | ICD-10-CM

## 2019-03-06 DIAGNOSIS — Z79899 Other long term (current) drug therapy: Secondary | ICD-10-CM | POA: Insufficient documentation

## 2019-03-06 DIAGNOSIS — R402432 Glasgow coma scale score 3-8, at arrival to emergency department: Secondary | ICD-10-CM | POA: Diagnosis present

## 2019-03-06 DIAGNOSIS — S59912A Unspecified injury of left forearm, initial encounter: Secondary | ICD-10-CM | POA: Insufficient documentation

## 2019-03-06 DIAGNOSIS — R55 Syncope and collapse: Secondary | ICD-10-CM | POA: Insufficient documentation

## 2019-03-06 DIAGNOSIS — S0193XA Puncture wound without foreign body of unspecified part of head, initial encounter: Secondary | ICD-10-CM | POA: Insufficient documentation

## 2019-03-06 DIAGNOSIS — Y908 Blood alcohol level of 240 mg/100 ml or more: Secondary | ICD-10-CM | POA: Diagnosis present

## 2019-03-06 DIAGNOSIS — Y999 Unspecified external cause status: Secondary | ICD-10-CM | POA: Insufficient documentation

## 2019-03-06 DIAGNOSIS — S0990XA Unspecified injury of head, initial encounter: Secondary | ICD-10-CM

## 2019-03-06 DIAGNOSIS — F319 Bipolar disorder, unspecified: Secondary | ICD-10-CM | POA: Insufficient documentation

## 2019-03-06 DIAGNOSIS — R000 Tachycardia, unspecified: Secondary | ICD-10-CM | POA: Diagnosis not present

## 2019-03-06 DIAGNOSIS — Y929 Unspecified place or not applicable: Secondary | ICD-10-CM | POA: Insufficient documentation

## 2019-03-06 DIAGNOSIS — F1020 Alcohol dependence, uncomplicated: Secondary | ICD-10-CM | POA: Diagnosis present

## 2019-03-06 DIAGNOSIS — I10 Essential (primary) hypertension: Secondary | ICD-10-CM | POA: Diagnosis present

## 2019-03-06 DIAGNOSIS — Z7901 Long term (current) use of anticoagulants: Secondary | ICD-10-CM

## 2019-03-06 DIAGNOSIS — S0184XA Puncture wound with foreign body of other part of head, initial encounter: Secondary | ICD-10-CM | POA: Diagnosis present

## 2019-03-06 DIAGNOSIS — J9601 Acute respiratory failure with hypoxia: Secondary | ICD-10-CM | POA: Diagnosis present

## 2019-03-06 DIAGNOSIS — E232 Diabetes insipidus: Secondary | ICD-10-CM | POA: Diagnosis not present

## 2019-03-06 DIAGNOSIS — Y939 Activity, unspecified: Secondary | ICD-10-CM | POA: Insufficient documentation

## 2019-03-06 DIAGNOSIS — S4992XA Unspecified injury of left shoulder and upper arm, initial encounter: Secondary | ICD-10-CM

## 2019-03-06 DIAGNOSIS — S51832A Puncture wound without foreign body of left forearm, initial encounter: Secondary | ICD-10-CM | POA: Diagnosis present

## 2019-03-06 DIAGNOSIS — F1010 Alcohol abuse, uncomplicated: Secondary | ICD-10-CM | POA: Insufficient documentation

## 2019-03-06 LAB — URINALYSIS, ROUTINE W REFLEX MICROSCOPIC
Bilirubin Urine: NEGATIVE
GLUCOSE, UA: NEGATIVE mg/dL
Ketones, ur: NEGATIVE mg/dL
Leukocytes,Ua: NEGATIVE
Nitrite: NEGATIVE
Protein, ur: 100 mg/dL — AB
Specific Gravity, Urine: 1.008 (ref 1.005–1.030)
pH: 5 (ref 5.0–8.0)

## 2019-03-06 LAB — RAPID URINE DRUG SCREEN, HOSP PERFORMED
AMPHETAMINES: NOT DETECTED
BARBITURATES: NOT DETECTED
Benzodiazepines: POSITIVE — AB
Cocaine: POSITIVE — AB
Opiates: NOT DETECTED
TETRAHYDROCANNABINOL: NOT DETECTED

## 2019-03-06 LAB — POCT I-STAT 7, (LYTES, BLD GAS, ICA,H+H)
Acid-base deficit: 2 mmol/L (ref 0.0–2.0)
Bicarbonate: 24.8 mmol/L (ref 20.0–28.0)
Calcium, Ion: 1.03 mmol/L — ABNORMAL LOW (ref 1.15–1.40)
HCT: 44 % (ref 39.0–52.0)
Hemoglobin: 15 g/dL (ref 13.0–17.0)
O2 Saturation: 100 %
POTASSIUM: 3.5 mmol/L (ref 3.5–5.1)
Sodium: 141 mmol/L (ref 135–145)
TCO2: 26 mmol/L (ref 22–32)
pCO2 arterial: 46.3 mmHg (ref 32.0–48.0)
pH, Arterial: 7.331 — ABNORMAL LOW (ref 7.350–7.450)
pO2, Arterial: 196 mmHg — ABNORMAL HIGH (ref 83.0–108.0)

## 2019-03-06 LAB — COMPREHENSIVE METABOLIC PANEL
ALT: 28 U/L (ref 0–44)
AST: 53 U/L — ABNORMAL HIGH (ref 15–41)
Albumin: 3.8 g/dL (ref 3.5–5.0)
Alkaline Phosphatase: 51 U/L (ref 38–126)
Anion gap: 11 (ref 5–15)
BUN: 8 mg/dL (ref 6–20)
CHLORIDE: 107 mmol/L (ref 98–111)
CO2: 21 mmol/L — ABNORMAL LOW (ref 22–32)
Calcium: 8 mg/dL — ABNORMAL LOW (ref 8.9–10.3)
Creatinine, Ser: 0.86 mg/dL (ref 0.61–1.24)
GFR calc Af Amer: >60 mL/min (ref >60)
GFR calc non Af Amer: >60 mL/min (ref >60)
Glucose, Bld: 150 mg/dL — ABNORMAL HIGH (ref 70–99)
POTASSIUM: 3.5 mmol/L (ref 3.5–5.1)
Sodium: 139 mmol/L (ref 135–145)
Total Bilirubin: 0.6 mg/dL (ref 0.3–1.2)
Total Protein: 7.1 g/dL (ref 6.5–8.1)

## 2019-03-06 LAB — CBC
HCT: 43.6 % (ref 39.0–52.0)
Hemoglobin: 14.1 g/dL (ref 13.0–17.0)
MCH: 29.9 pg (ref 26.0–34.0)
MCHC: 32.3 g/dL (ref 30.0–36.0)
MCV: 92.4 fL (ref 80.0–100.0)
Platelets: 172 10*3/uL (ref 150–400)
RBC: 4.72 MIL/uL (ref 4.22–5.81)
RDW: 12 % (ref 11.5–15.5)
WBC: 9.4 10*3/uL (ref 4.0–10.5)
nRBC: 0 % (ref 0.0–0.2)

## 2019-03-06 LAB — PROTIME-INR
INR: 1.3 — ABNORMAL HIGH (ref 0.8–1.2)
Prothrombin Time: 16.3 seconds — ABNORMAL HIGH (ref 11.4–15.2)

## 2019-03-06 LAB — LACTIC ACID, PLASMA: Lactic Acid, Venous: 2.8 mmol/L (ref 0.5–1.9)

## 2019-03-06 LAB — CDS SEROLOGY

## 2019-03-06 LAB — ETHANOL: Alcohol, Ethyl (B): 247 mg/dL — ABNORMAL HIGH (ref <10)

## 2019-03-06 MED ORDER — PANTOPRAZOLE SODIUM 40 MG IV SOLR
40.0000 mg | Freq: Every day | INTRAVENOUS | Status: DC
  Administered 2019-03-07: 40 mg via INTRAVENOUS
  Filled 2019-03-06: qty 40

## 2019-03-06 MED ORDER — PANTOPRAZOLE SODIUM 40 MG PO TBEC: 40.0000 mg | DELAYED_RELEASE_TABLET | Freq: Every day | ORAL | Status: DC

## 2019-03-06 MED ORDER — CHLORHEXIDINE GLUCONATE 0.12% ORAL RINSE (MEDLINE KIT)
15.0000 mL | Freq: Two times a day (BID) | OROMUCOSAL | Status: DC
  Administered 2019-03-07: 15 mL via OROMUCOSAL

## 2019-03-06 MED ORDER — METOPROLOL TARTRATE 5 MG/5ML IV SOLN
5.0000 mg | Freq: Four times a day (QID) | INTRAVENOUS | Status: DC | PRN
  Administered 2019-03-07: 5 mg via INTRAVENOUS
  Filled 2019-03-06: qty 5

## 2019-03-06 MED ORDER — LEVETIRACETAM IN NACL 1500 MG/100ML IV SOLN
1500.0000 mg | Freq: Once | INTRAVENOUS | Status: DC
  Filled 2019-03-06: qty 100

## 2019-03-06 MED ORDER — ORAL CARE MOUTH RINSE
15.0000 mL | OROMUCOSAL | Status: DC
  Administered 2019-03-07 (×5): 15 mL via OROMUCOSAL

## 2019-03-06 MED ORDER — HYDRALAZINE HCL 20 MG/ML IJ SOLN: 10.0000 mg | INTRAMUSCULAR | Status: DC | PRN

## 2019-03-06 MED ORDER — ORAL CARE MOUTH RINSE: 15.0000 mL | OROMUCOSAL | Status: DC

## 2019-03-06 MED ORDER — LEVETIRACETAM IN NACL 1500 MG/100ML IV SOLN
1500.0000 mg | Freq: Two times a day (BID) | INTRAVENOUS | Status: DC
  Filled 2019-03-06 (×2): qty 100

## 2019-03-06 MED ORDER — LEVETIRACETAM IN NACL 1000 MG/100ML IV SOLN
1000.0000 mg | Freq: Two times a day (BID) | INTRAVENOUS | Status: DC
  Administered 2019-03-07: 1000 mg via INTRAVENOUS
  Filled 2019-03-06: qty 100

## 2019-03-06 MED ORDER — SODIUM CHLORIDE 0.9 % IV SOLN
INTRAVENOUS | Status: DC
  Administered 2019-03-07 (×2): via INTRAVENOUS

## 2019-03-06 MED ORDER — CHLORHEXIDINE GLUCONATE 0.12% ORAL RINSE (MEDLINE KIT): 15.0000 mL | Freq: Two times a day (BID) | OROMUCOSAL | Status: DC
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
## 2019-03-06 NOTE — ED Notes (Signed)
To CT

## 2019-03-06 NOTE — Progress Notes (Signed)
Patient transported to CT and back on ventilator with no complications. Vitals stable.  ?

## 2019-03-06 NOTE — Progress Notes (Signed)
RT to patient bedside to secure ETT. Patient intubated by ED Physician upon arrival, tube secured at 26cm at lip. Patient suctioned and immediately placed on [facility] Ventilator.

## 2019-03-06 NOTE — Progress Notes (Signed)
Pt transported to ICU from ER on vent.  RT will continue to monitor.
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
## 2019-03-06 NOTE — ED Notes (Signed)
No signature at this time d/t patient stability.

## 2019-03-07 ENCOUNTER — Inpatient Hospital Stay (HOSPITAL_COMMUNITY): Payer: Self-pay

## 2019-03-07 LAB — PREPARE FRESH FROZEN PLASMA
Unit division: 0
Unit division: 0

## 2019-03-07 LAB — BASIC METABOLIC PANEL
Anion gap: 7 (ref 5–15)
BUN: 7 mg/dL (ref 6–20)
CO2: 21 mmol/L — ABNORMAL LOW (ref 22–32)
Calcium: 7.2 mg/dL — ABNORMAL LOW (ref 8.9–10.3)
Chloride: 125 mmol/L — ABNORMAL HIGH (ref 98–111)
Creatinine, Ser: 1.26 mg/dL — ABNORMAL HIGH (ref 0.61–1.24)
GFR calc Af Amer: >60 mL/min (ref >60)
GFR calc non Af Amer: >60 mL/min (ref >60)
Glucose, Bld: 102 mg/dL — ABNORMAL HIGH (ref 70–99)
Potassium: 3.8 mmol/L (ref 3.5–5.1)
Sodium: 153 mmol/L — ABNORMAL HIGH (ref 135–145)

## 2019-03-07 LAB — TYPE AND SCREEN
ABO/RH(D): B POS
Antibody Screen: NEGATIVE
Unit division: 0
Unit division: 0

## 2019-03-07 LAB — BPAM RBC
BLOOD PRODUCT EXPIRATION DATE: 202004032359
Blood Product Expiration Date: 202004042359
ISSUE DATE / TIME: 202003082042
ISSUE DATE / TIME: 202003082042
UNIT TYPE AND RH: 5100
Unit Type and Rh: 5100

## 2019-03-07 LAB — BPAM FFP
Blood Product Expiration Date: 202003132359
Blood Product Expiration Date: 202003132359
ISSUE DATE / TIME: 202003082042
ISSUE DATE / TIME: 202003082042
UNIT TYPE AND RH: 6200
Unit Type and Rh: 6200

## 2019-03-07 LAB — CBC
HCT: 38.3 % — ABNORMAL LOW (ref 39.0–52.0)
Hemoglobin: 12.1 g/dL — ABNORMAL LOW (ref 13.0–17.0)
MCH: 29.7 pg (ref 26.0–34.0)
MCHC: 31.6 g/dL (ref 30.0–36.0)
MCV: 93.9 fL (ref 80.0–100.0)
NRBC: 0 % (ref 0.0–0.2)
Platelets: 123 10*3/uL — ABNORMAL LOW (ref 150–400)
RBC: 4.08 MIL/uL — ABNORMAL LOW (ref 4.22–5.81)
RDW: 12.1 % (ref 11.5–15.5)
WBC: 8.2 10*3/uL (ref 4.0–10.5)

## 2019-03-07 LAB — HIV ANTIBODY (ROUTINE TESTING W REFLEX): HIV Screen 4th Generation wRfx: NONREACTIVE

## 2019-03-07 LAB — ABO/RH: ABO/RH(D): B POS

## 2019-03-07 LAB — MRSA PCR SCREENING: MRSA by PCR: NEGATIVE

## 2019-03-07 MED ORDER — PHENYLEPHRINE HCL-NACL 10-0.9 MG/250ML-% IV SOLN
0.0000 ug/min | INTRAVENOUS | Status: DC
  Administered 2019-03-07: 40 ug/min via INTRAVENOUS

## 2019-03-07 MED ORDER — PHENYLEPHRINE HCL-NACL 10-0.9 MG/250ML-% IV SOLN
25.0000 ug/min | INTRAVENOUS | Status: DC
  Administered 2019-03-07: 125 ug/min via INTRAVENOUS
  Administered 2019-03-07 (×3): 150 ug/min via INTRAVENOUS
  Administered 2019-03-07: 60 ug/min via INTRAVENOUS
  Administered 2019-03-07: 160 ug/min via INTRAVENOUS
  Administered 2019-03-07 (×2): 150 ug/min via INTRAVENOUS
  Administered 2019-03-07: 170 ug/min via INTRAVENOUS
  Administered 2019-03-07: 150 ug/min via INTRAVENOUS
  Filled 2019-03-07 (×14): qty 250

## 2019-03-07 MED ORDER — FENTANYL CITRATE (PF) 100 MCG/2ML IJ SOLN
50.0000 ug | INTRAMUSCULAR | Status: DC | PRN
  Administered 2019-03-07: 50 ug via INTRAVENOUS
  Filled 2019-03-07: qty 2

## 2019-03-07 MED ORDER — VASOPRESSIN 20 UNIT/ML IV SOLN
0.0300 [IU]/min | INTRAVENOUS | Status: DC
  Filled 2019-03-07: qty 2

## 2019-03-07 MED ORDER — ALBUMIN HUMAN 5 % IV SOLN
INTRAVENOUS | Status: AC
  Filled 2019-03-07: qty 250

## 2019-03-07 MED ORDER — TECHNETIUM TC 99M EXAMETAZIME IV KIT
20.6000 | PACK | Freq: Once | INTRAVENOUS | Status: AC | PRN
  Administered 2019-03-07: 20.6 via INTRAVENOUS

## 2019-03-07 MED ORDER — MANNITOL 25 % IV SOLN
INTRAVENOUS | Status: AC
  Filled 2019-03-07: qty 50

## 2019-03-07 MED ORDER — NOREPINEPHRINE 4 MG/250ML-% IV SOLN
2.0000 ug/min | INTRAVENOUS | Status: DC
  Filled 2019-03-07: qty 250

## 2019-03-07 MED ORDER — SODIUM CHLORIDE 0.9 % IV BOLUS
1000.0000 mL | INTRAVENOUS | Status: DC | PRN
  Administered 2019-03-07 (×2): 1000 mL via INTRAVENOUS

## 2019-03-07 MED ORDER — PHENYLEPHRINE HCL-NACL 10-0.9 MG/250ML-% IV SOLN
INTRAVENOUS | Status: AC
  Administered 2019-03-07: 40 ug/min via INTRAVENOUS
  Filled 2019-03-07: qty 250
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
## 2019-03-30 NOTE — Progress Notes (Signed)
MD called to bedside as pt BP drop, Informed family to return so they may speak with Dr. Fredricka Bonine. NS bolus given and neo ordered

## 2019-03-30 NOTE — Progress Notes (Signed)
No change in status.  Remains without brain or brainstem function.  Cerebral blood flow study pending.  No new recommendations.
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
## 2019-03-30 NOTE — Progress Notes (Signed)
MD informed of pt increase in HR to 140's. Fentanyl 50 mcg Q1H PRN ordered.

## 2019-03-30 NOTE — Progress Notes (Signed)
Pt with evolving nonsurvivable brain injury, tachycardia around midnight, subsequently started to drop pressures around 01:30 and going into DI, as anticipated. Has received about 3.5L crystalloid and started neo. Will continue pressor/ fluid support for now. His brother is on the way back, is picking up his mother. Earlier this evening his brother and sister were not ready to make any decision regarding code status

## 2019-03-30 NOTE — Progress Notes (Signed)
Patient family updated once more- his mother, father, brother, sister and a cousin are at the bedside. Patient has no corneal/ cough/ gag, not breathing over the vent. Pressures remain steady with slowly increasing neo requirement, now @150 . Discussed with family again that this is not reversible or survivable. Discussed that he is not in pain. Discussed options, pending definitive brain death exam, of comfort care, terminal extubation or consideration of organ donation. They will continue to be with him and have not yet definitively decided what he would want.

## 2019-03-30 NOTE — Progress Notes (Signed)
Pt extubated using withdrawal guidelines.  RN @ bedside. 

## 2019-03-30 NOTE — Progress Notes (Signed)
After family meeting with CDS (including patient's mother, father, daughter, sister, brothers and numerous other family members) and Family [employer] manager, [employer], Francee Piccolo, family decided to forego organ donation and terminally extubate.  Patient was extubated at 1735 and was in asystole at 1744.  No heart sounds were auscultated confirmed by Berniece Pap, RN and Carilyn Goodpasture, RN.  All drips were stopped at this time. Large family contingency returned to room after extubation and stayed with patient for 15 minutes. Will wait to hear about funeral arrangements. ME will be notified, patient will be prepared and taken to morgue. Taylah Dubiel C

## 2019-03-30 NOTE — Progress Notes (Signed)
Pt transported from 4N20 to Nuclear Medicine and back without incident.

## 2019-03-30 DEATH — deceased

## 2019-12-01 IMAGING — CT CT ANGIO CHEST
2 of 6 series · 17 of 46 positions shown · IV contrast (APPLIED)
Comparison: None.

CLINICAL DATA: Cough and shortness of breath with chest wall pain

EXAM:
CT ANGIOGRAPHY CHEST WITH CONTRAST
TECHNIQUE: Multidetector CT imaging of the chest was performed using the
standard protocol during bolus administration of intravenous
contrast. Multiplanar CT image reconstructions and MIPs were
obtained to evaluate the vascular anatomy.
CONTRAST:  75mL 7HHNNI-JWA IOPAMIDOL (7HHNNI-JWA) INJECTION 76%

[Series 5: thins · axial · 0.71mm/px · z∈[-258,-24]mm · 14 of 258 slices shown]
[im 12/258  lung]
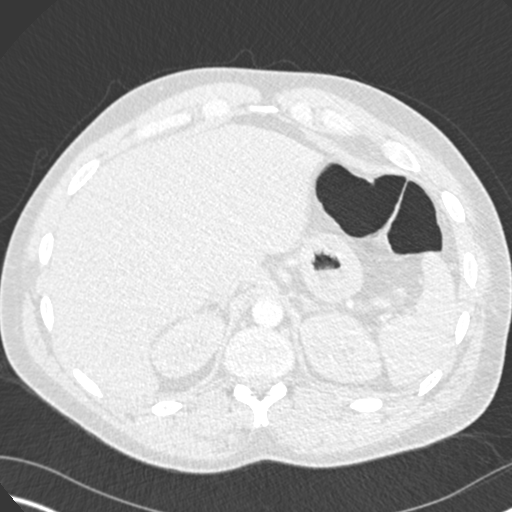
[im 34/258  soft-tissue]
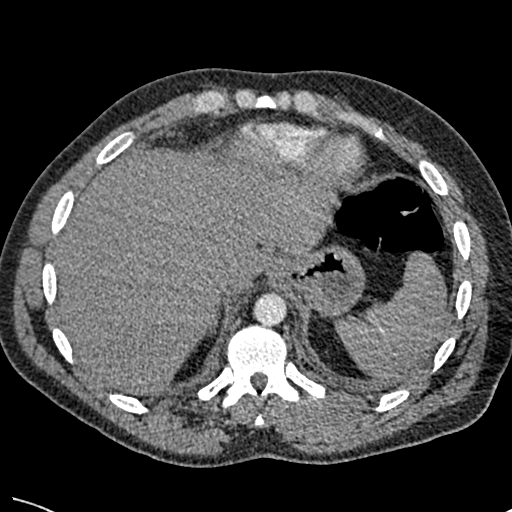
[im 45/258  lung]
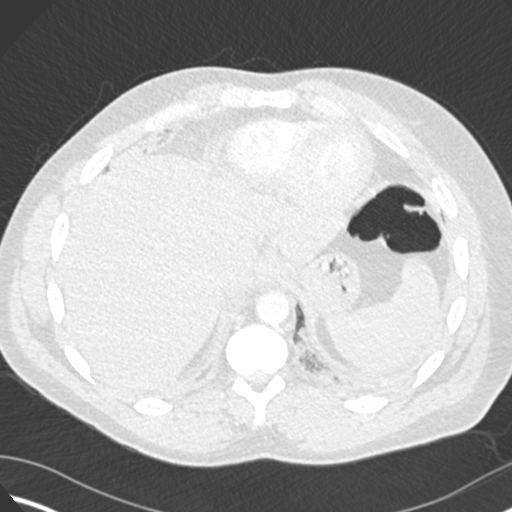
[im 68/258  soft-tissue]
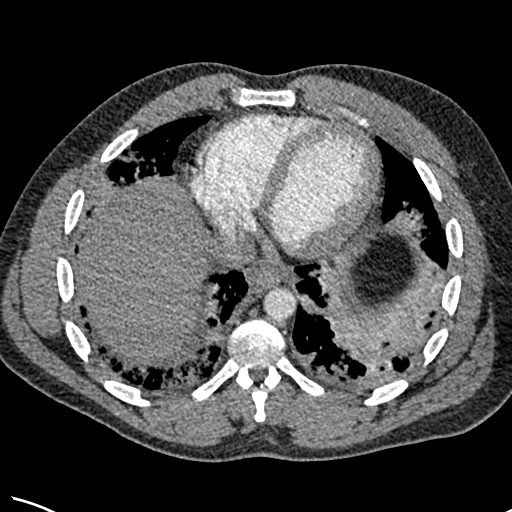
[im 90/258  lung]
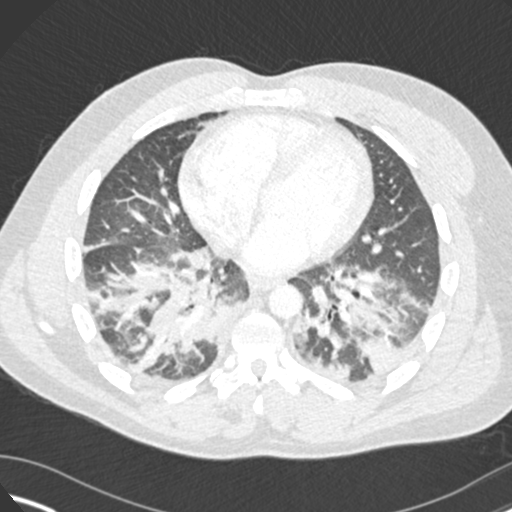
[im 101/258  soft-tissue]
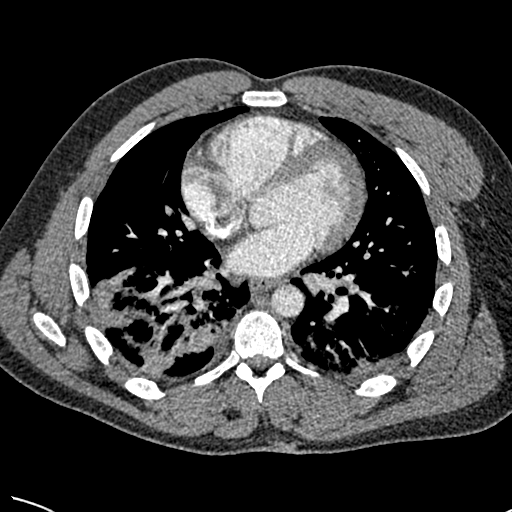
[im 123/258  lung]
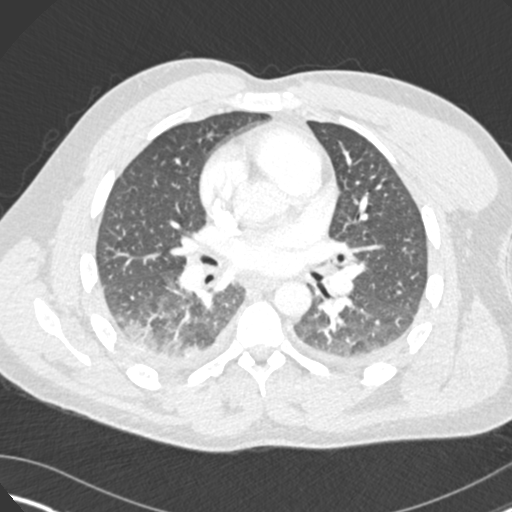
[im 135/258  soft-tissue]
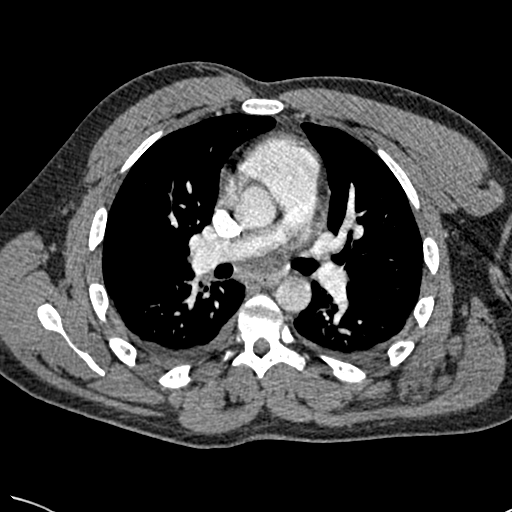
[im 157/258  lung]
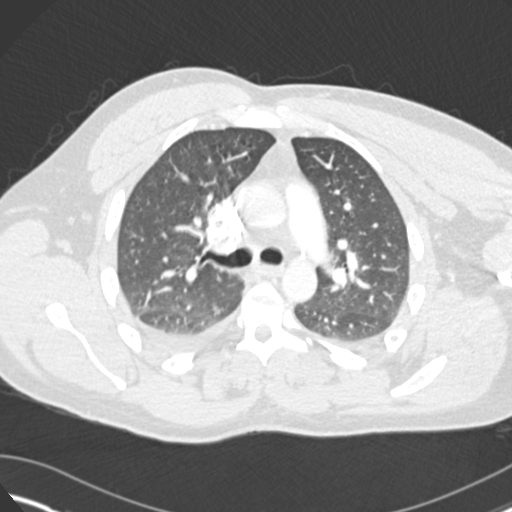
[im 168/258  soft-tissue]
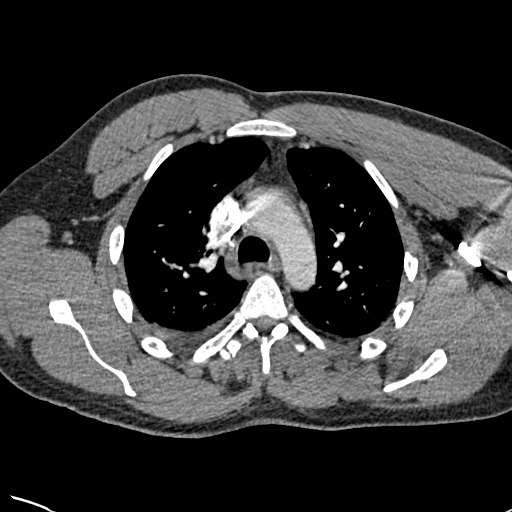
[im 190/258  lung]
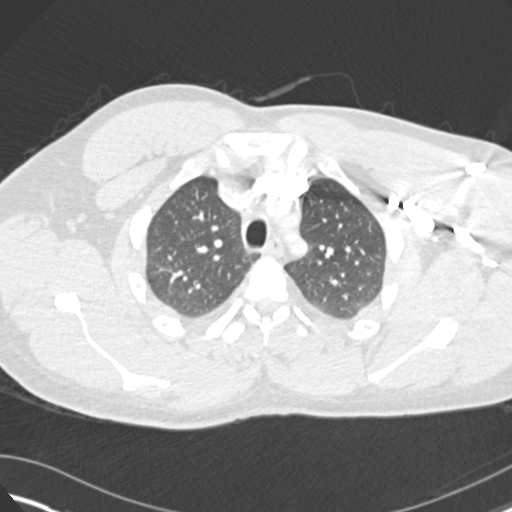
[im 213/258  soft-tissue]
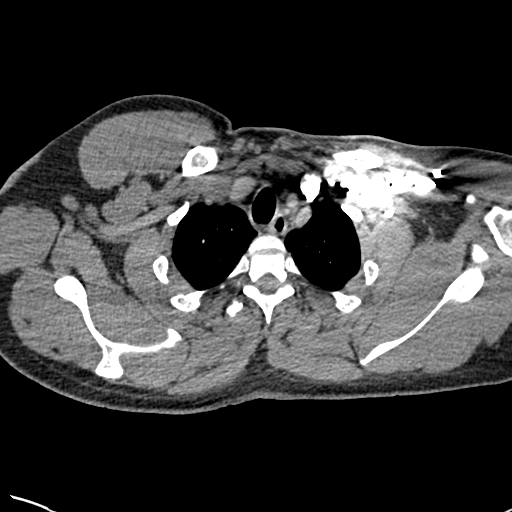
[im 224/258  lung]
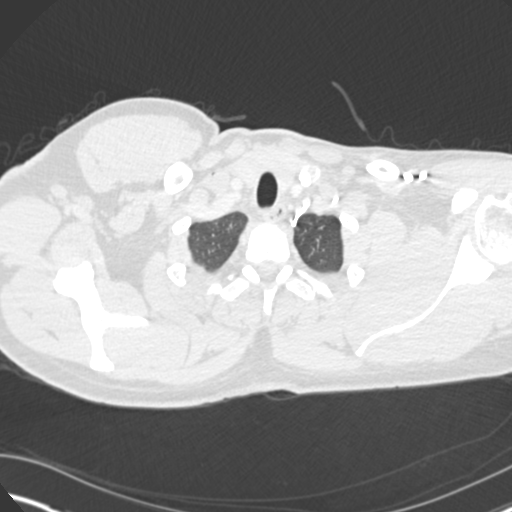
[im 246/258  soft-tissue]
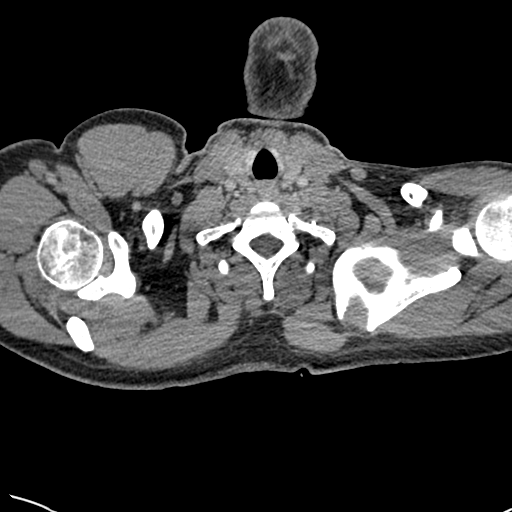

[Series 7: coronal mpr · coronal · 0.50mm/px · 3 of 86 slices shown]
[im 22/86  soft-tissue]
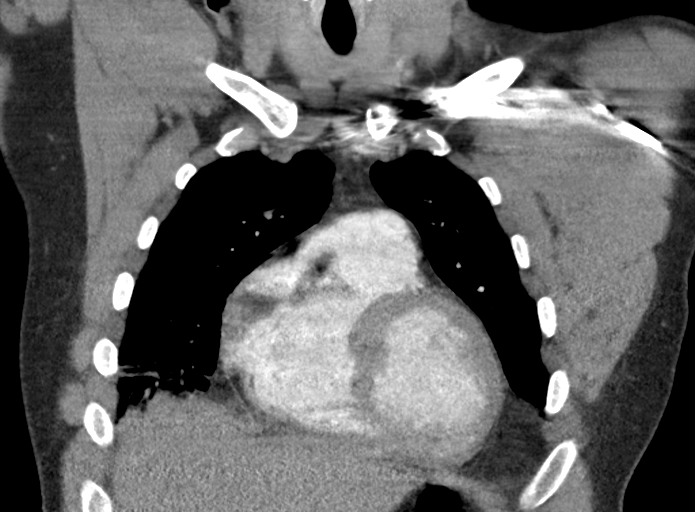
[im 43/86  soft-tissue]
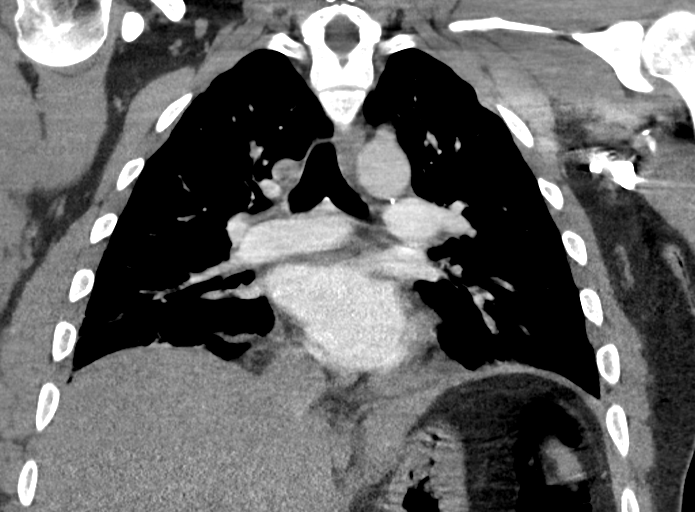
[im 64/86  soft-tissue]
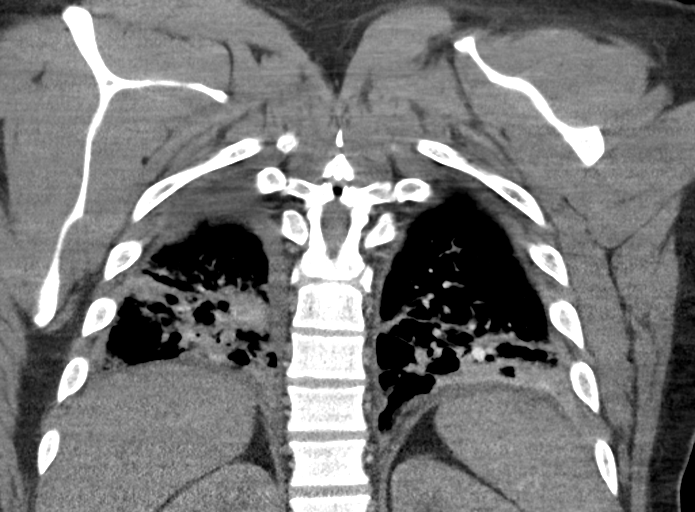

[17 of 46 positions shown; findings below may reference images not displayed]

FINDINGS: Cardiovascular:

--Pulmonary arteries: Contrast injection is sufficient to
demonstrate satisfactory opacification of the pulmonary arteries to
the segmental level.There are bilateral pulmonary emboli. On the
right, there are emboli within segmental branches of the upper and
middle lobes. On the left, the emboli are in the proximal segmental
branches of the lower lobe. There is no evidence of right heart
strain. The main pulmonary artery is within normal limits for size.

--Aorta: Limited opacification of the aorta due to bolus timing
optimization for the pulmonary arteries. Conventional 3 vessel
aortic branching pattern. The aortic course and caliber are normal.
There is no aortic atherosclerosis.

--Heart: Mild cardiomegaly. No pericardial effusion.

Mediastinum/Nodes: No mediastinal, hilar or axillary
lymphadenopathy. The visualized thyroid and thoracic esophageal
course are unremarkable.

Lungs/Pleura: Bibasilar consolidation or atelectasis. No pleural
effusion or pneumothorax.

Upper Abdomen: Contrast bolus timing is not optimized for evaluation
of the abdominal organs. Within this limitation, the visualized
organs of the upper abdomen are normal.

Musculoskeletal: No chest wall abnormality. No acute or significant
osseous findings.

Review of the MIP images confirms the above findings.
IMPRESSION: 1. Bilateral segmental pulmonary emboli, including the segmental
branches of the right upper and middle lobes and the left lobe.
2. No CT evidence of right heart strain.
3. Bibasilar opacities which could indicate atelectasis or
pneumonia.

Critical Value/emergent results were called by telephone at the time
of interpretation on 10/27/2018 at [DATE] to Dr. BLISS MEHRI
, who verbally acknowledged these results.

## 2019-12-01 IMAGING — CR DG CHEST 2V
1 series · 2 of 2 positions shown · non-contrast
Comparison: 10/26/2018

CLINICAL DATA: Shortness of breath, chest pain

EXAM:
CHEST - 2 VIEW

[Series 1: dg chest 2 view · 0.14mm/px · 2 of 2 slices shown]
[im 1/2]
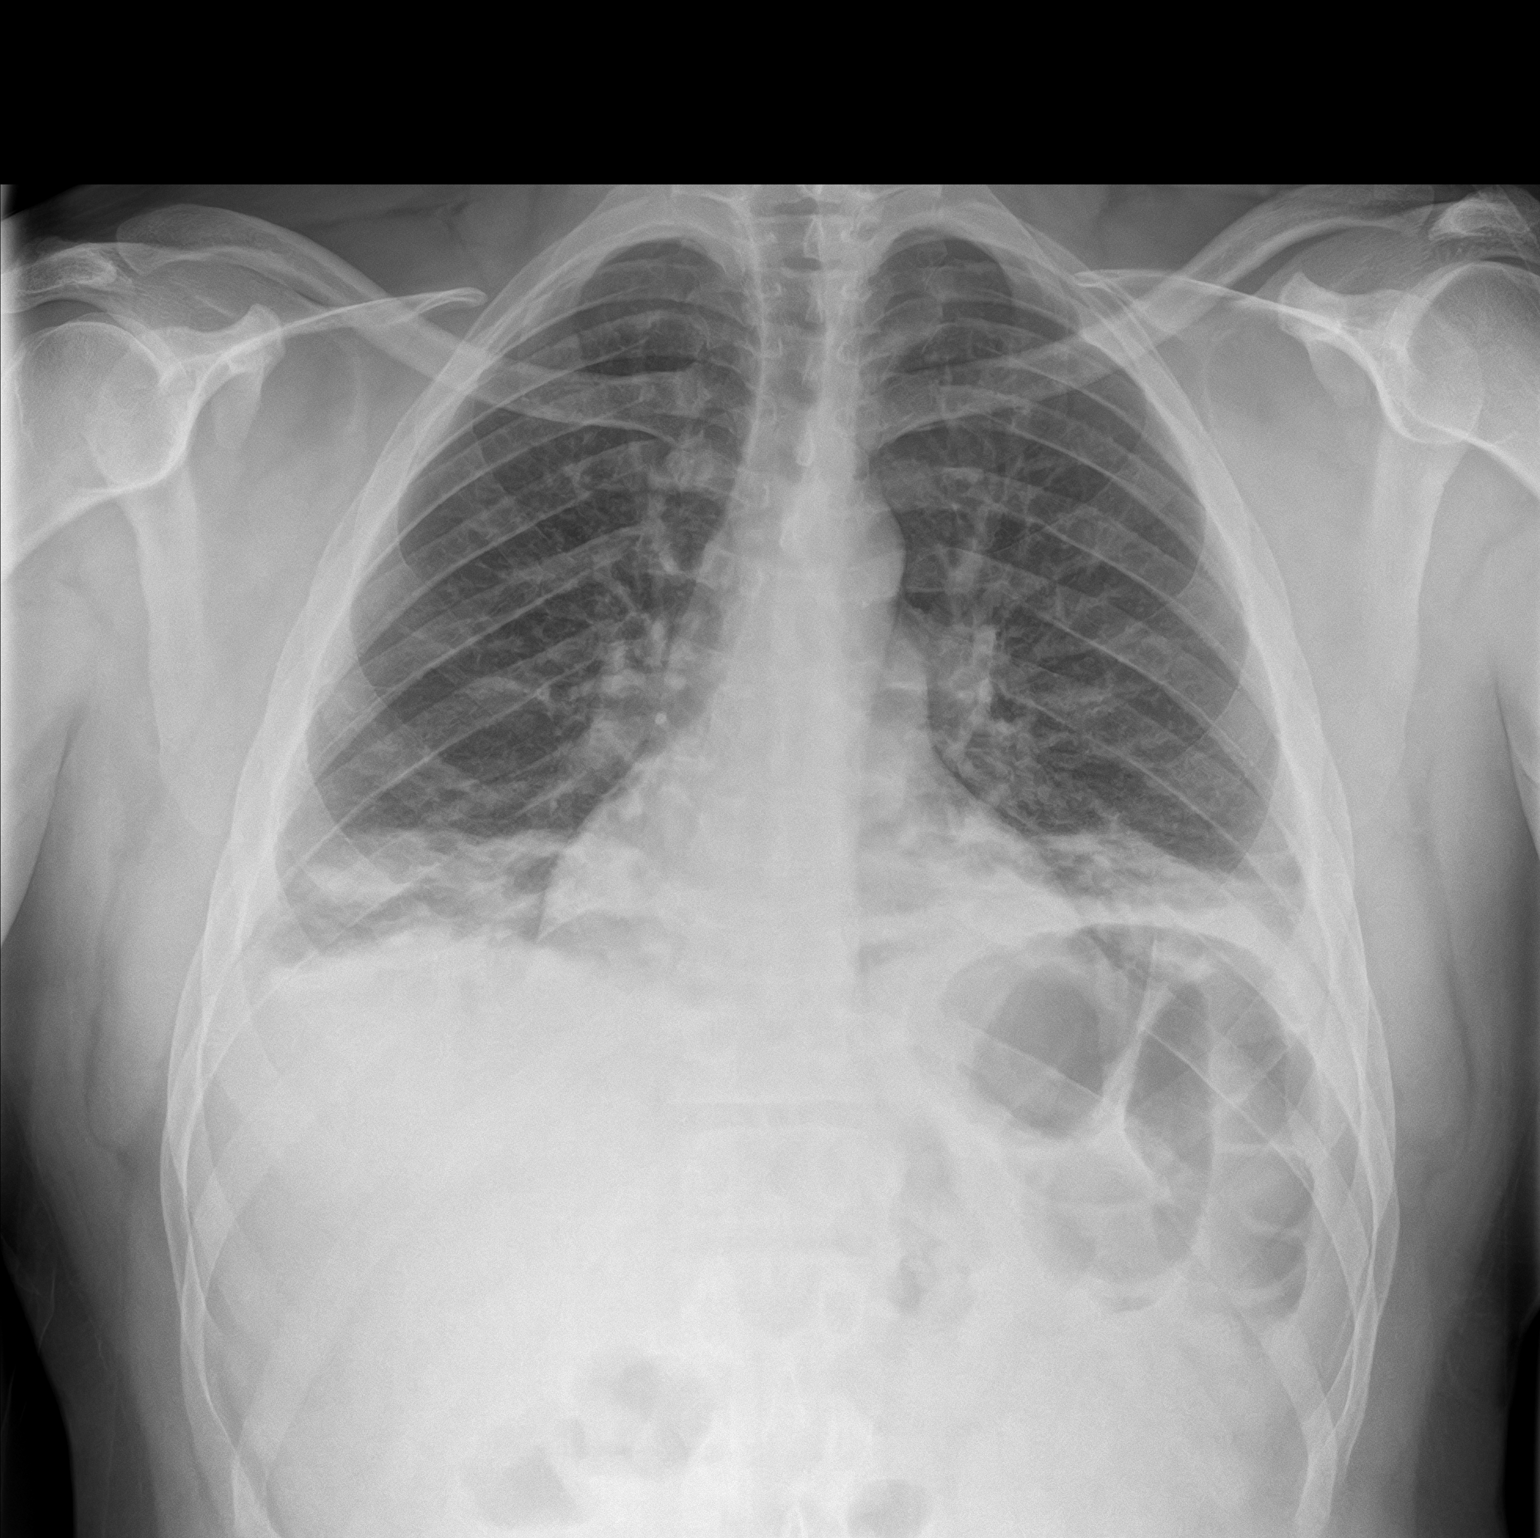
[im 2/2]
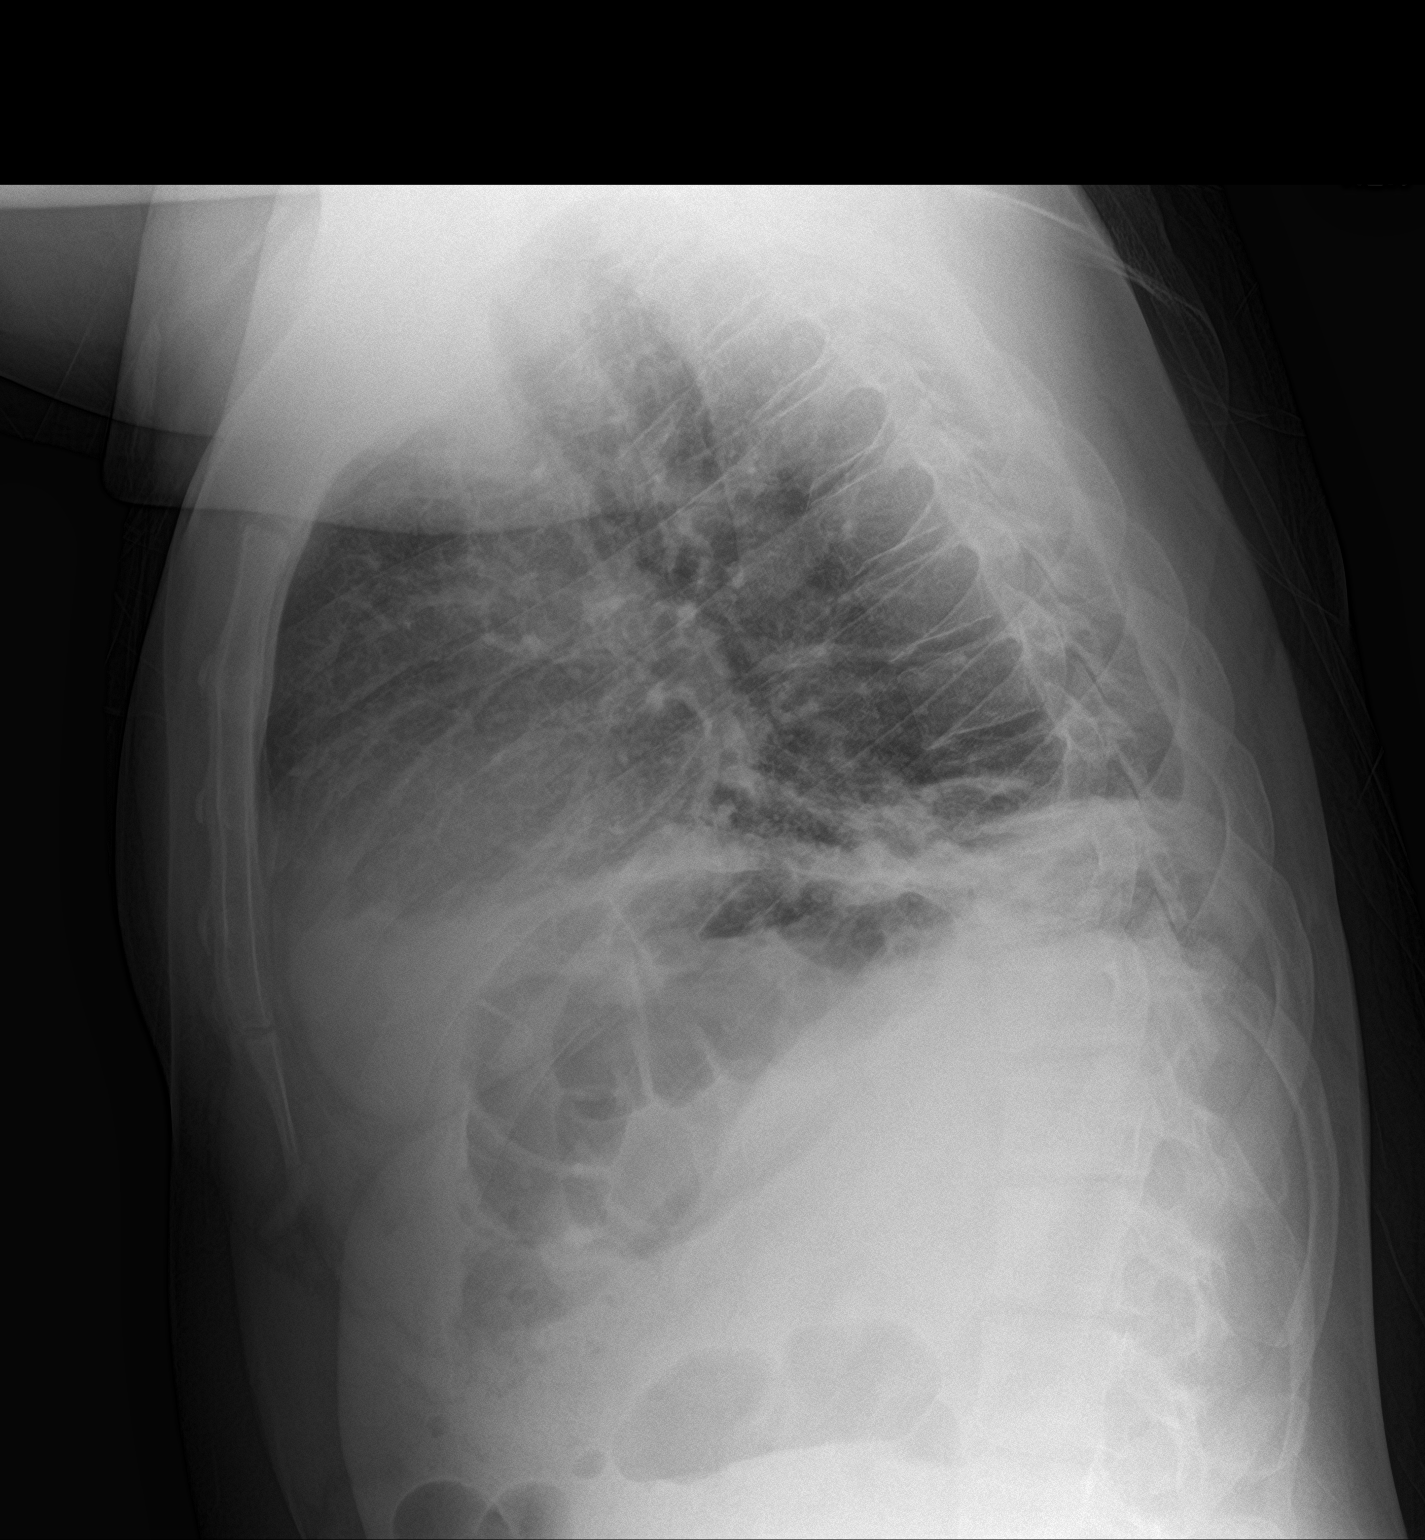

[2 of 2 positions shown; findings below may reference images not displayed]

FINDINGS: Bibasilar opacities likely atelectasis. Heart is normal size. No
effusions or acute bony abnormality.
IMPRESSION: Bibasilar atelectasis.
